# Patient Record
Sex: Female | Born: 1960 | Race: White | Hispanic: No | Smoking: Current every day smoker
Health system: Southern US, Community
[De-identification: ages and names within clinical notes are randomized; demographics above are authoritative.]

## PROBLEM LIST (undated history)

## (undated) DIAGNOSIS — F32A Depression, unspecified: Secondary | ICD-10-CM

## (undated) DIAGNOSIS — F329 Major depressive disorder, single episode, unspecified: Secondary | ICD-10-CM

## (undated) HISTORY — PX: TUBAL LIGATION: SHX77

## (undated) HISTORY — PX: OTHER SURGICAL HISTORY: SHX169

## (undated) HISTORY — PX: NECK SURGERY: SHX720

## (undated) HISTORY — DX: Depression, unspecified: F32.A

---
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
## 2001-12-18 ENCOUNTER — Inpatient Hospital Stay (HOSPITAL_COMMUNITY): Admission: EM | Admit: 2001-12-18 | Discharge: 2001-12-24 | Payer: Self-pay | Admitting: Psychiatry

## 2004-04-06 ENCOUNTER — Encounter: Admission: RE | Admit: 2004-04-06 | Discharge: 2004-04-06 | Payer: Self-pay | Admitting: Obstetrics and Gynecology

## 2004-05-30 ENCOUNTER — Ambulatory Visit (HOSPITAL_COMMUNITY): Admission: RE | Admit: 2004-05-30 | Discharge: 2004-05-30 | Payer: Self-pay | Admitting: Obstetrics and Gynecology

## 2005-01-11 ENCOUNTER — Encounter: Admission: RE | Admit: 2005-01-11 | Discharge: 2005-01-11 | Payer: Self-pay | Admitting: Obstetrics and Gynecology

## 2005-02-19 ENCOUNTER — Inpatient Hospital Stay (HOSPITAL_COMMUNITY): Admission: AD | Admit: 2005-02-19 | Discharge: 2005-02-20 | Payer: Self-pay | Admitting: Obstetrics and Gynecology

## 2005-03-01 ENCOUNTER — Inpatient Hospital Stay (HOSPITAL_COMMUNITY): Admission: AD | Admit: 2005-03-01 | Discharge: 2005-03-02 | Payer: Self-pay | Admitting: Obstetrics and Gynecology

## 2005-03-03 ENCOUNTER — Inpatient Hospital Stay (HOSPITAL_COMMUNITY): Admission: AD | Admit: 2005-03-03 | Discharge: 2005-03-03 | Payer: Self-pay | Admitting: Obstetrics & Gynecology

## 2005-03-05 ENCOUNTER — Inpatient Hospital Stay (HOSPITAL_COMMUNITY): Admission: AD | Admit: 2005-03-05 | Discharge: 2005-03-08 | Payer: Self-pay | Admitting: Obstetrics and Gynecology

## 2005-04-18 ENCOUNTER — Other Ambulatory Visit: Admission: RE | Admit: 2005-04-18 | Discharge: 2005-04-18 | Payer: Self-pay | Admitting: Otolaryngology

## 2006-04-09 ENCOUNTER — Inpatient Hospital Stay (HOSPITAL_COMMUNITY): Admission: AD | Admit: 2006-04-09 | Discharge: 2006-04-12 | Payer: Self-pay | Admitting: Obstetrics and Gynecology

## 2006-09-26 IMAGING — CR DG CHEST 2V
2 series · 2 of 2 positions shown · non-contrast
Comparison: 01/11/05.

CLINICAL DATA: Hypoxia and abnormal breath sounds on physical exam.  Status post c-section.
 CHEST ? 2 VIEW:

[view not recorded (1 of 2)]
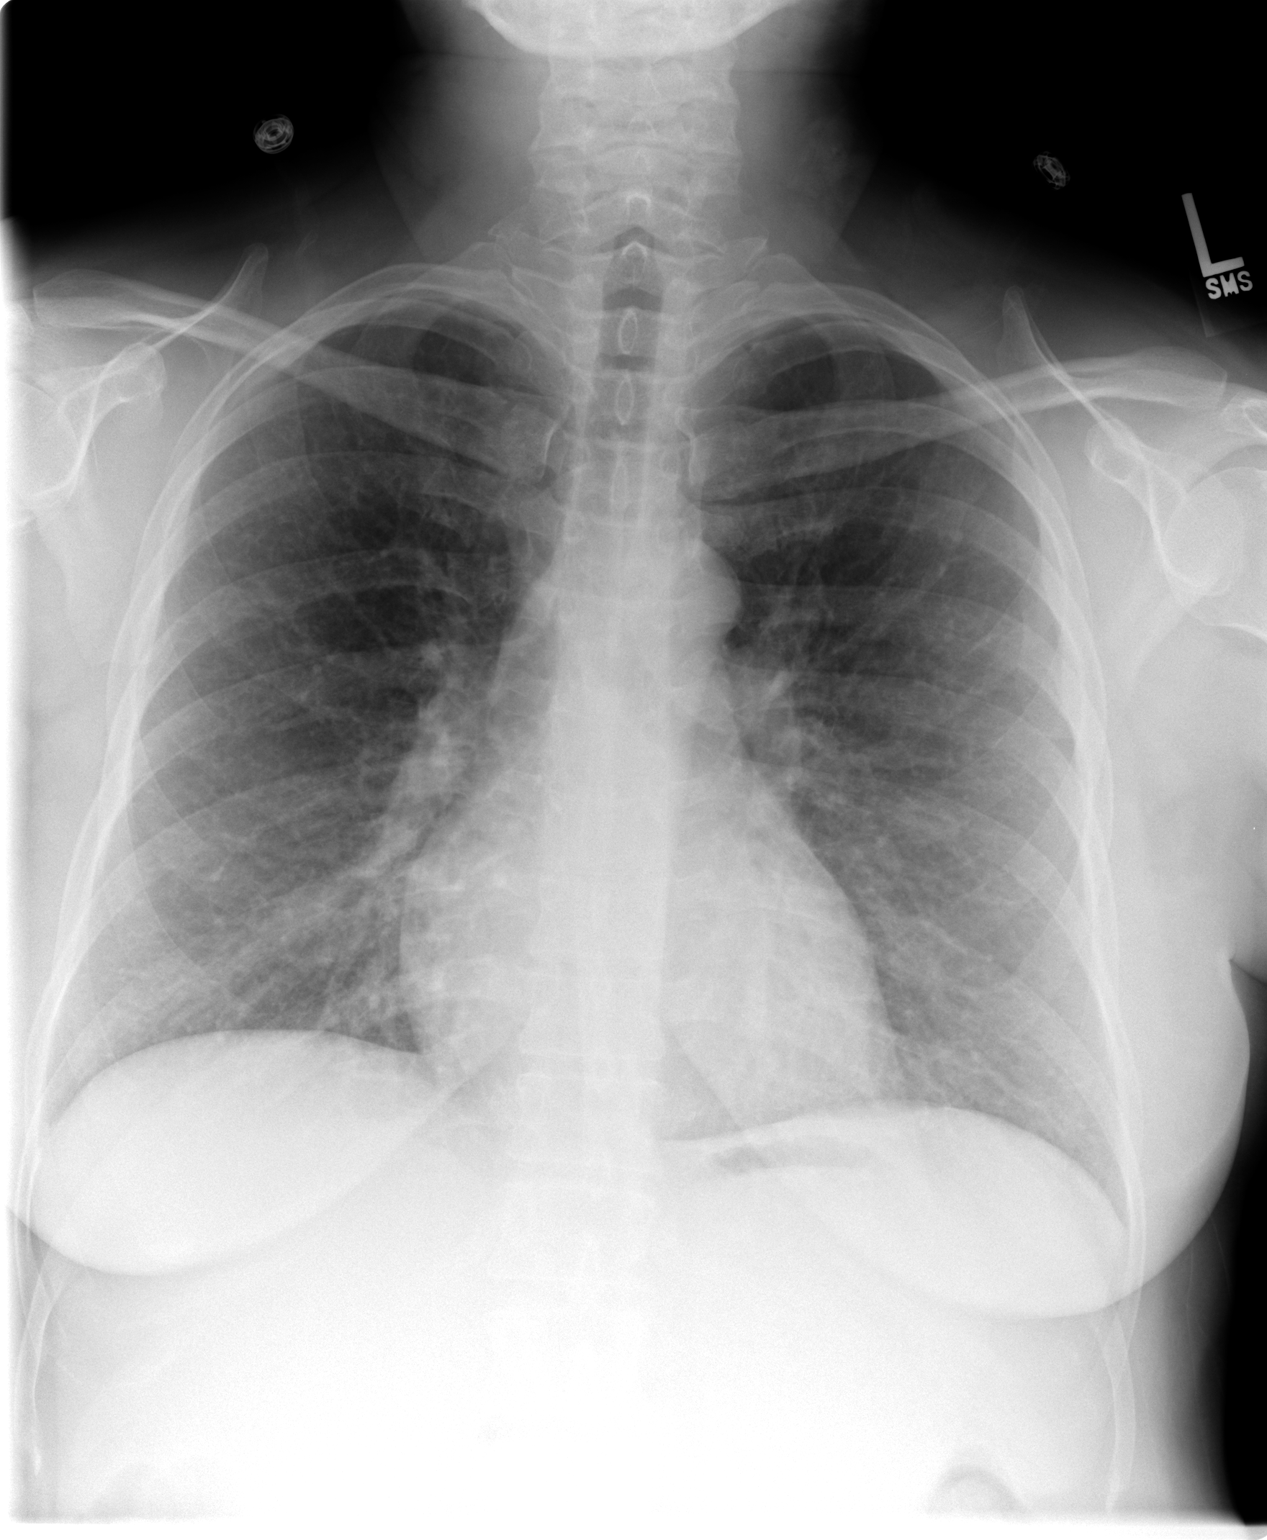

[view not recorded (2 of 2)]
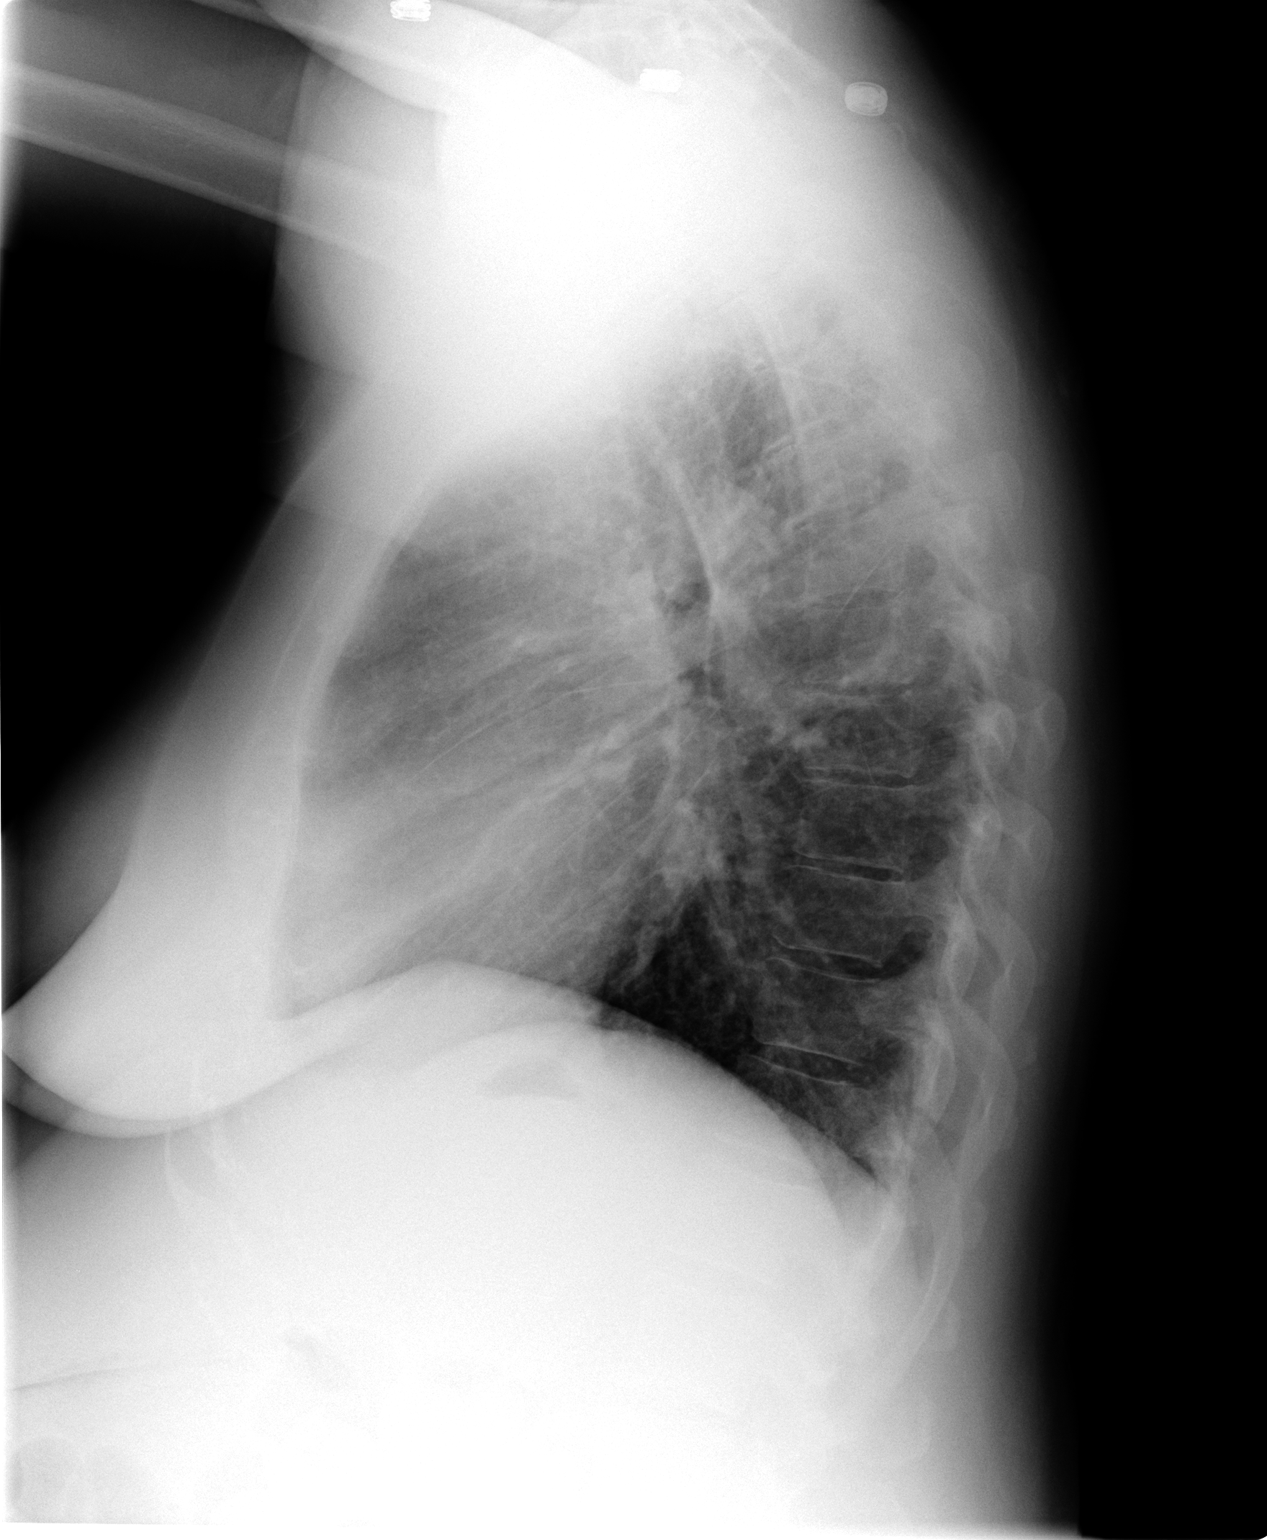

[2 of 2 positions shown; findings below may reference images not displayed]

FINDINGS: Heart size is normal.  Chronic interstitial prominence is seen which is stable.  There is no evidence of acute infiltrate or edema.  There is no evidence of pleural effusion.  No mass or adenopathy identified.
IMPRESSION: Stable chest.  No active disease.

## 2008-05-26 ENCOUNTER — Inpatient Hospital Stay (HOSPITAL_COMMUNITY): Admission: EM | Admit: 2008-05-26 | Discharge: 2008-05-27 | Payer: Self-pay | Admitting: Emergency Medicine

## 2009-12-16 IMAGING — CR DG CHEST 2V
2 series · 2 of 2 positions shown · non-contrast
Comparison: Radiographs 03/06/2005

CLINICAL DATA: Chest

CHEST - 2 VIEW

[w chest pa]
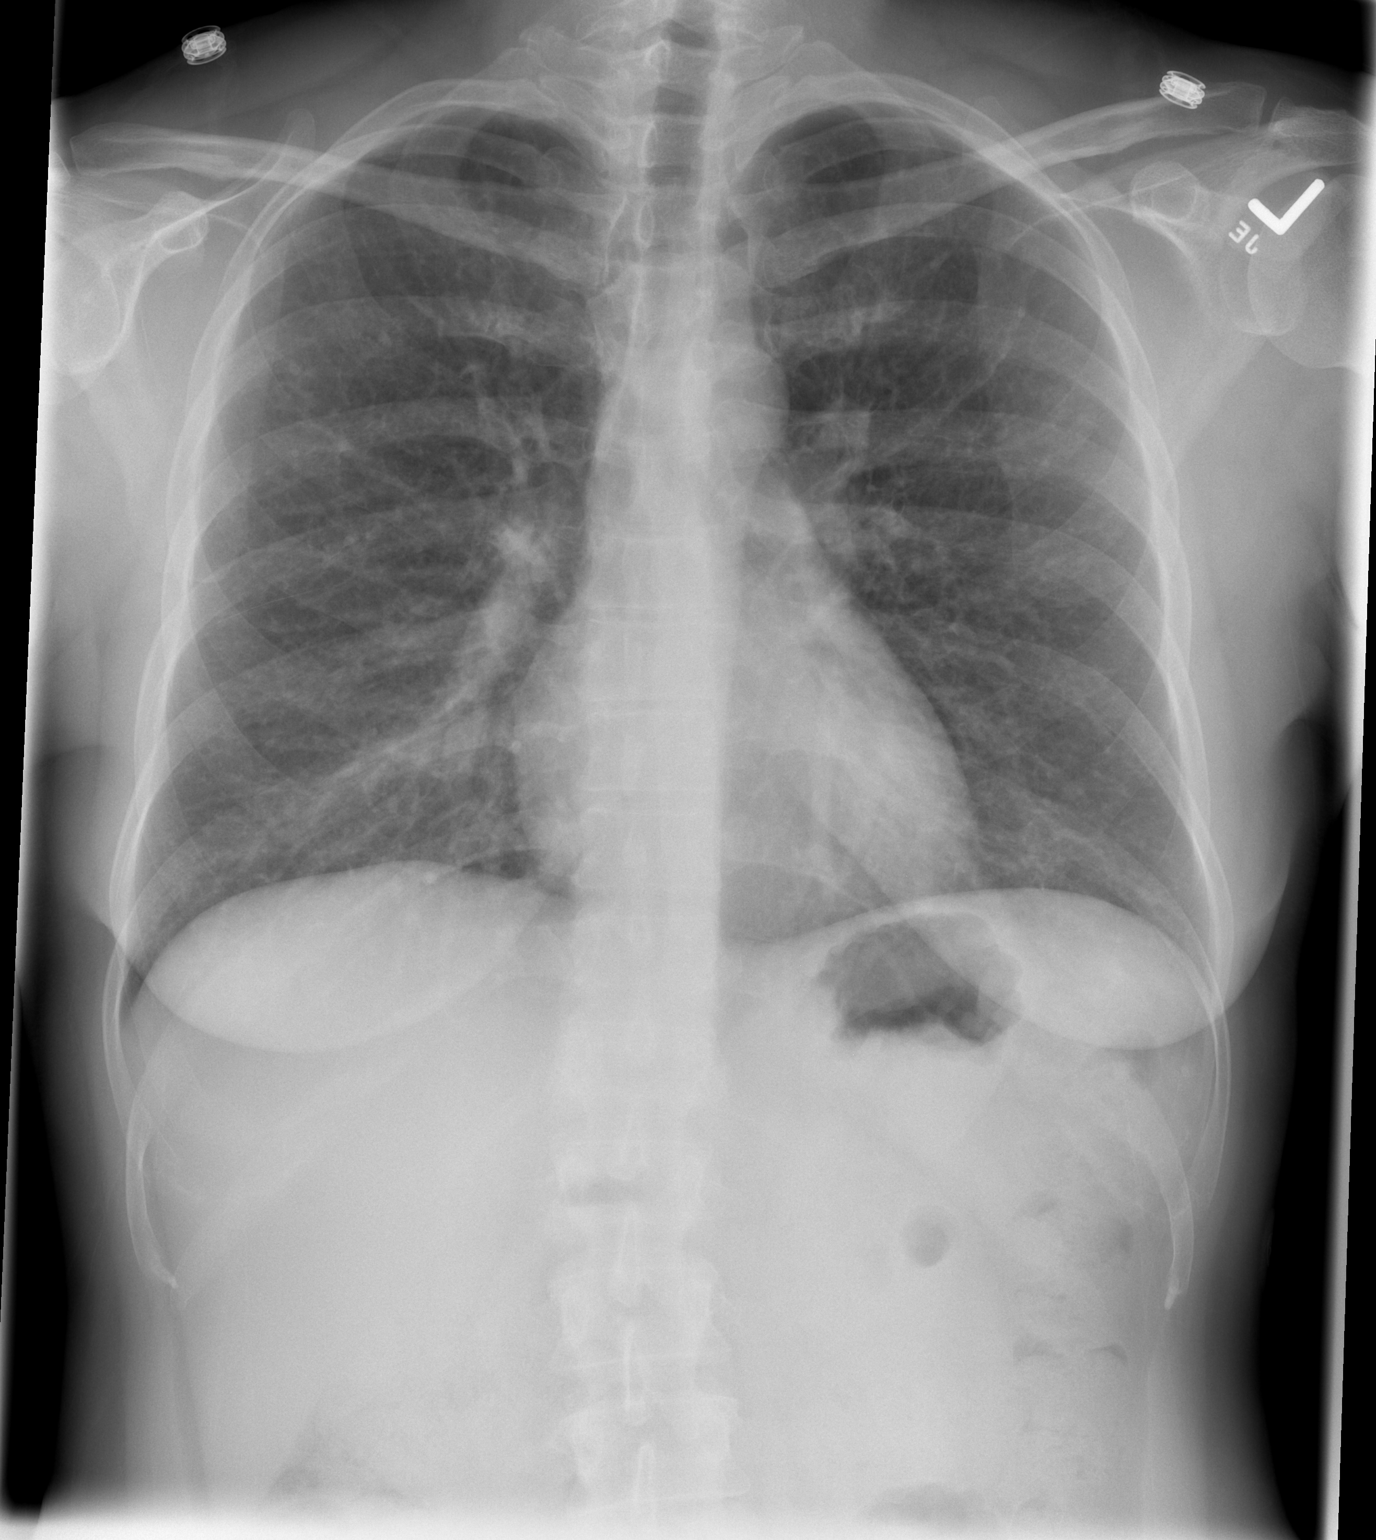

[w chest lat]
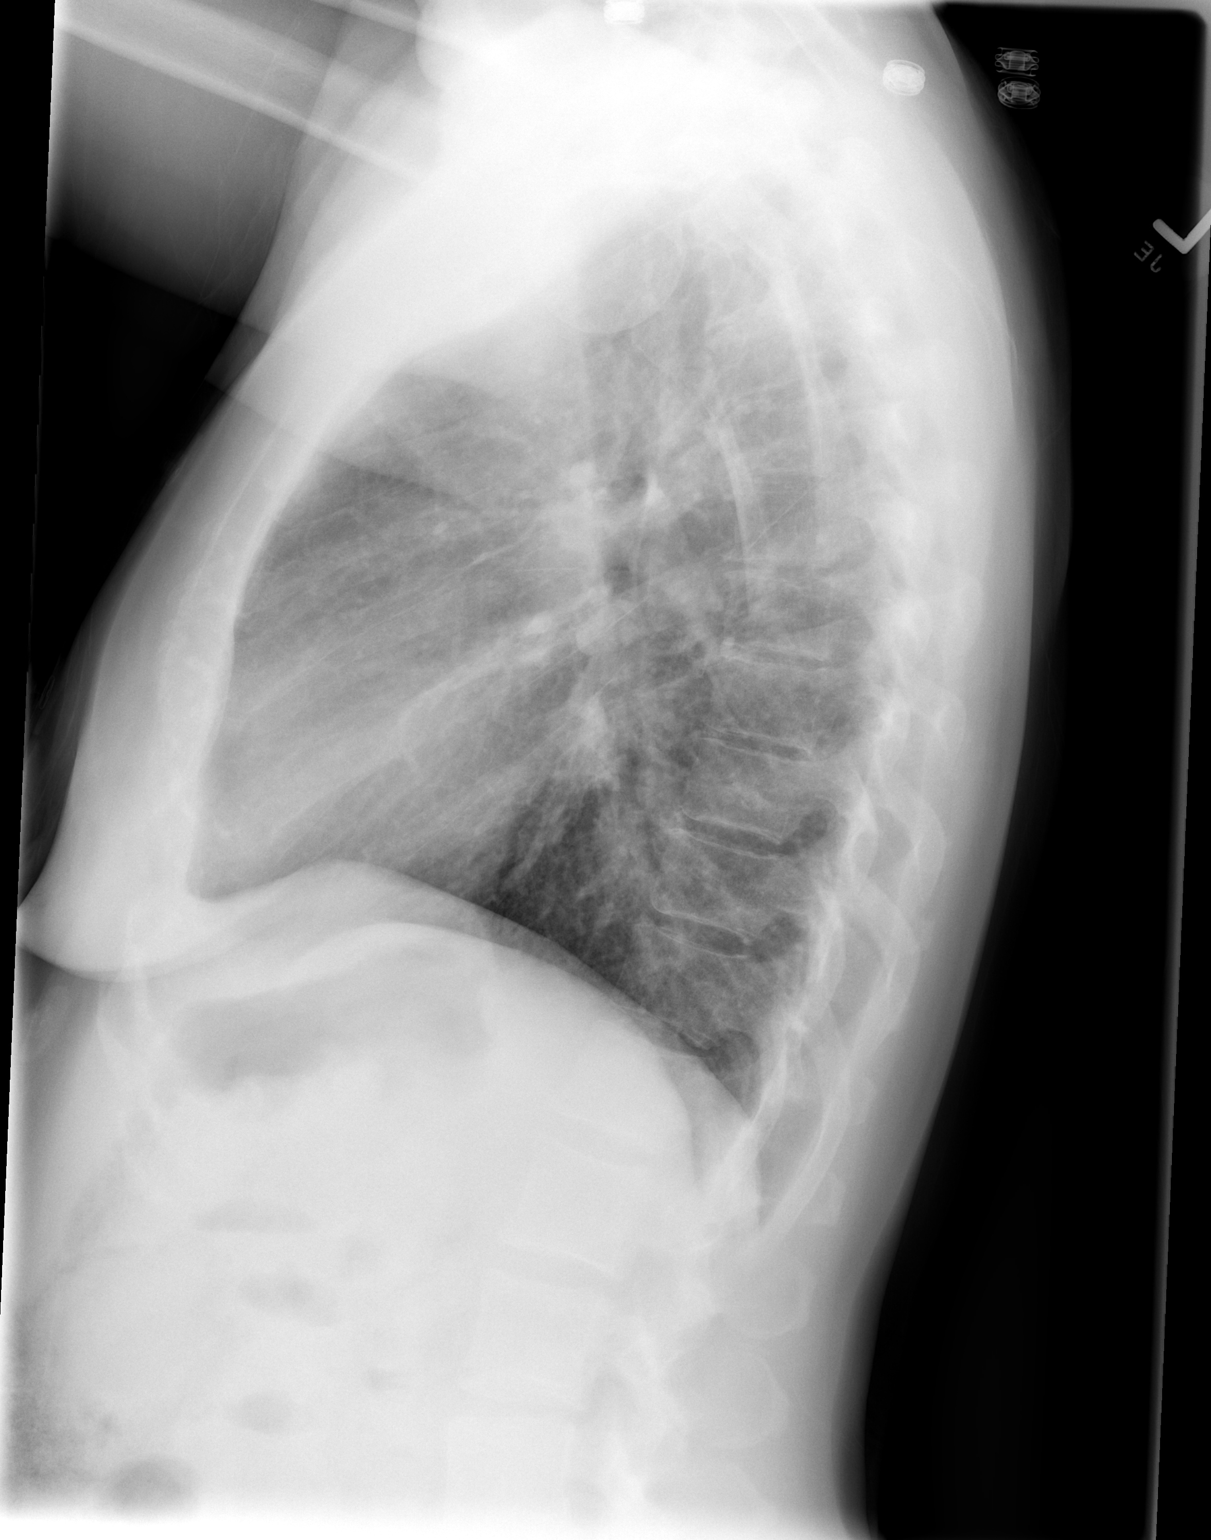

[2 of 2 positions shown; findings below may reference images not displayed]

FINDINGS: Chronic interstitial changes.  The heart and mediastinal
contours are normal. Thoracic spine degenerative changes.
IMPRESSION: No acute cardiopulmonary process.

## 2010-05-07 ENCOUNTER — Encounter: Payer: Self-pay | Admitting: Obstetrics and Gynecology

## 2010-08-01 LAB — POCT CARDIAC MARKERS
CKMB, poc: <1 ng/mL — ABNORMAL LOW (ref 1.0–8.0)
CKMB, poc: <1 ng/mL — ABNORMAL LOW (ref 1.0–8.0)
Myoglobin, poc: 34.2 ng/mL (ref 12–200)
Myoglobin, poc: 38.2 ng/mL (ref 12–200)
Troponin i, poc: <0.05 ng/mL (ref 0.00–0.09)
Troponin i, poc: <0.05 ng/mL (ref 0.00–0.09)

## 2010-08-01 LAB — COMPREHENSIVE METABOLIC PANEL
ALT: 12 U/L (ref 0–35)
AST: 11 U/L (ref 0–37)
Albumin: 3.9 g/dL (ref 3.5–5.2)
Alkaline Phosphatase: 72 U/L (ref 39–117)
BUN: 9 mg/dL (ref 6–23)
CO2: 24 mEq/L (ref 19–32)
Calcium: 8.6 mg/dL (ref 8.4–10.5)
Chloride: 101 mEq/L (ref 96–112)
Creatinine, Ser: 0.57 mg/dL (ref 0.4–1.2)
GFR calc Af Amer: >60 mL/min (ref >60)
GFR calc non Af Amer: >60 mL/min (ref >60)
Glucose, Bld: 93 mg/dL (ref 70–99)
Potassium: 3.1 mEq/L — ABNORMAL LOW (ref 3.5–5.1)
Sodium: 134 mEq/L — ABNORMAL LOW (ref 135–145)
Total Bilirubin: 0.3 mg/dL (ref 0.3–1.2)
Total Protein: 7.1 g/dL (ref 6.0–8.3)

## 2010-08-01 LAB — LIPID PANEL
Cholesterol: 132 mg/dL (ref 0–200)
HDL: 24 mg/dL — ABNORMAL LOW (ref >39)
LDL Cholesterol: 88 mg/dL (ref 0–99)
Total CHOL/HDL Ratio: 5.5 RATIO
Triglycerides: 99 mg/dL (ref <150)
VLDL: 20 mg/dL (ref 0–40)

## 2010-08-01 LAB — POCT I-STAT 3, ART BLOOD GAS (G3+)
Acid-base deficit: 3 mmol/L — ABNORMAL HIGH (ref 0.0–2.0)
Bicarbonate: 21.1 mEq/L (ref 20.0–24.0)
O2 Saturation: 93 %
TCO2: 22 mmol/L (ref 0–100)
pCO2 arterial: 35.1 mmHg (ref 35.0–45.0)
pH, Arterial: 7.387 (ref 7.350–7.400)
pO2, Arterial: 67 mmHg — ABNORMAL LOW (ref 80.0–100.0)

## 2010-08-01 LAB — CK TOTAL AND CKMB (NOT AT ARMC)
CK, MB: 0.6 ng/mL (ref 0.3–4.0)
Relative Index: INVALID (ref 0.0–2.5)
Total CK: 55 U/L (ref 7–177)

## 2010-08-01 LAB — POCT I-STAT 3, VENOUS BLOOD GAS (G3P V)
Acid-base deficit: 5 mmol/L — ABNORMAL HIGH (ref 0.0–2.0)
Bicarbonate: 21 mEq/L (ref 20.0–24.0)
O2 Saturation: 71 %
TCO2: 22 mmol/L (ref 0–100)
pCO2, Ven: 39.6 mmHg — ABNORMAL LOW (ref 45.0–50.0)
pH, Ven: 7.333 — ABNORMAL HIGH (ref 7.250–7.300)
pO2, Ven: 39 mmHg (ref 30.0–45.0)

## 2010-08-01 LAB — APTT: aPTT: 29 seconds (ref 24–37)

## 2010-08-01 LAB — CARDIAC PANEL(CRET KIN+CKTOT+MB+TROPI)
CK, MB: 0.5 ng/mL (ref 0.3–4.0)
CK, MB: 1 ng/mL (ref 0.3–4.0)
Relative Index: INVALID (ref 0.0–2.5)
Relative Index: INVALID (ref 0.0–2.5)
Total CK: 53 U/L (ref 7–177)
Total CK: 57 U/L (ref 7–177)
Troponin I: 0.01 ng/mL (ref 0.00–0.06)
Troponin I: <0.01 ng/mL (ref 0.00–0.06)

## 2010-08-01 LAB — BASIC METABOLIC PANEL
BUN: 7 mg/dL (ref 6–23)
CO2: 25 mEq/L (ref 19–32)
Calcium: 8.6 mg/dL (ref 8.4–10.5)
Chloride: 109 mEq/L (ref 96–112)
Creatinine, Ser: 0.53 mg/dL (ref 0.4–1.2)
GFR calc Af Amer: >60 mL/min (ref >60)
GFR calc non Af Amer: >60 mL/min (ref >60)
Glucose, Bld: 87 mg/dL (ref 70–99)
Potassium: 3.8 mEq/L (ref 3.5–5.1)
Sodium: 139 mEq/L (ref 135–145)

## 2010-08-01 LAB — DIFFERENTIAL
Basophils Absolute: 0.1 10*3/uL (ref 0.0–0.1)
Basophils Relative: 2 % — ABNORMAL HIGH (ref 0–1)
Eosinophils Absolute: 0.1 10*3/uL (ref 0.0–0.7)
Eosinophils Relative: 2 % (ref 0–5)
Lymphocytes Relative: 30 % (ref 12–46)
Lymphs Abs: 2.9 10*3/uL (ref 0.7–4.0)
Monocytes Absolute: 0.4 10*3/uL (ref 0.1–1.0)
Monocytes Relative: 5 % (ref 3–12)
Neutro Abs: 5.9 10*3/uL (ref 1.7–7.7)
Neutrophils Relative %: 62 % (ref 43–77)

## 2010-08-01 LAB — CBC
HCT: 43.4 % (ref 36.0–46.0)
Hemoglobin: 15.1 g/dL — ABNORMAL HIGH (ref 12.0–15.0)
MCHC: 34.9 g/dL (ref 30.0–36.0)
MCV: 90.7 fL (ref 78.0–100.0)
Platelets: 250 10*3/uL (ref 150–400)
RBC: 4.78 MIL/uL (ref 3.87–5.11)
RDW: 12.7 % (ref 11.5–15.5)
WBC: 9.4 10*3/uL (ref 4.0–10.5)

## 2010-08-01 LAB — MAGNESIUM: Magnesium: 2 mg/dL (ref 1.5–2.5)

## 2010-08-01 LAB — PROTIME-INR
INR: 1 (ref 0.00–1.49)
Prothrombin Time: 13.5 seconds (ref 11.6–15.2)

## 2010-08-01 LAB — TROPONIN I: Troponin I: <0.01 ng/mL (ref 0.00–0.06)

## 2010-08-01 LAB — D-DIMER, QUANTITATIVE: D-Dimer, Quant: 0.26 ug/mL-FEU (ref 0.00–0.48)

## 2010-08-29 NOTE — Discharge Summary (Signed)
NAME:  Margaret Paul, Margaret Paul NO.:  [id number]   MEDICAL RECORD NO.:  [id number]          PATIENT TYPE:  INP   LOCATION:  5501                         FACILITY:  [facility]   PHYSICIAN:  Marcellus Scott, MD     DATE OF BIRTH:  [birth year]   DATE OF ADMISSION:  05/26/2008  DATE OF DISCHARGE:  05/27/2008                               DISCHARGE SUMMARY   PRIMARY MEDICAL DOCTOR:  Gentry Fitz.   DISCHARGE DIAGNOSES:  1. Chest pain.  Cardiac workup negative.  Resolved.  2. Hypokalemia - repleted.  3. Tobacco abuse.  4. Dyslipidemia, low HDL.   DISCHARGE MEDICATIONS:  1. Prilosec over-the-counter 20 mg p.o. daily.  2. Nicotine 21 mg per 24-hour patch, 1 daily for 6 weeks, then taper.   PROCEDURES:  1. Cardiac catheterization by Dr. Elsie Lincoln.  Please refer to his      procedure note for details.  Results:      a.     Angiographically patent coronary arteries.      b.     Normal left ventricular systolic function, ejection fraction       60%.      c.     Normal right heart hemodynamics.      d.     Normal cardiac outputs.  He recommended candidate for discharge from cardiac standpoint 4 hours  after cardiac catheterization.  1. Chest x-ray on February 10:  No acute cardiopulmonary process.   PERTINENT LABORATORY DATA:  Cardiac panel cycled and negative.  Magnesium 2.  Basic metabolic panel with BUN 7, creatinine 0.53,  potassium 3.8.  Coagulation indices were normal.  Lipid panel with HDL  of 24, otherwise unremarkable.  D-dimer negative at 0.26.  CBCs with  hemoglobin 15.1, hematocrit 43.4, white blood cell 9.4, platelets 250.   CONSULTATIONS:  Dr. Elsie Lincoln from [facility].   HOSPITAL COURSE AND PATIENT DISPOSITION:  Margaret Paul is a 50 year old  female patient with history of smoking who presented with chest pain.  She was admitted for further evaluation and management.  Cardiology was  consulted.  Initially, a stress test was planned.   Subsequently however,  the patient underwent cardiac catheterization.  She had been on aspirin,  statins, nitro paste in the hospital.  Cardiac catheterization was  negative.  Cardiology has cleared her for discharge 4 hours after the  catheterization.  Unsure what the etiology of the chest pain is.  Question secondary to gastric etiology.  We will discharge the patient  on proton-pump inhibitor.  Will counsel strongly regarding tobacco  cessation.  The patient indicates she will definitely try this and has  requested a nicotine patch which will be provided.  The patient requests  assistance finding a primary medical doctor.  Her husband has [employer].  Will request case manager consult in  helping with the same.  We recommended diet and exercise to assist with  improvement in her low HDL.  Hypokalemia has been repleted.   Time spent on discharge, 20 minutes.      Theadora Rama  Waymon Amato, MD  Electronically Signed     AH/MEDQ  D:  05/27/2008  T:  05/27/2008  Job:  [id number]   cc:   Madaline Savage, M.D.

## 2010-08-29 NOTE — H&P (Signed)
NAME:  Margaret Paul, Margaret Paul NO.:  [id number]   MEDICAL RECORD NO.:  [id number]          PATIENT TYPE:  INP   LOCATION:  5501                         FACILITY:  [facility]   PHYSICIAN:  Massie Maroon, MD        DATE OF BIRTH:  May 08, 1960   DATE OF ADMISSION:  05/26/2008  DATE OF DISCHARGE:                              HISTORY & PHYSICAL   ER states she had no primary care physician.   CHIEF COMPLAINT:  Chest pain.   HISTORY OF PRESENT ILLNESS:  A 50 year old female with no significant  past medical history, complains of chest tightness beginning at about  1:30 this afternoon.  She went to sleep and it went away.  She woke up  at about 4:30 and experienced more chest tightness and so came to the  emergency room.  Her EKG showed normal sinus rhythm at 80, normal axis,  normal intervals, no ST-T segment changes consistent with acute  ischemia.  The patient was treated with sublingual nitroglycerin x1 per  patient without relief.  The patient states that the chest tightness is  substernal.  There is no radiation.  Nothing appears to make it  significantly better or worse.  Position does not seem to affect the  discomfort.  The patient denies any GERD, palpitations, shortness of  breath, nausea, vomiting, diaphoresis.  The patient does have some  cough, which is dry over the last couple of days.  The patient denies  any fever, chills.  Chest x-ray was negative for any acute infiltrate.  The patient will be admitted for workup of chest pain.   PAST MEDICAL HISTORY:  None.   PAST SURGICAL HISTORY:  Tubal ligation and reversal.   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  She  smokes 1-pack per day times 25 years.  She does not drink.   FAMILY HISTORY:  Her father had a valve replacement surgery.  There is  no family history of coronary artery disease.   REVIEW OF SYSTEMS:  Negative for all 10 oriented systems except for  pertinent positive as stated  above.   ALLERGIES:  No known drug allergies.   MEDICATIONS:  None.   PHYSICAL EXAMINATION:  VITALS:  Temperature 98.7, pulse 80, blood  pressure 125/78, pulse ox 98% on room air.  HEENT:  Anicteric, EOMI, no nystagmus, pupils 1.5 mm, symmetric, direct,  concentric, reflexes intact.  Mucous membranes moist.  Tongue midline.  NECK:  No JVD, no bruits, no thyromegaly, no adenopathy.  HEART:  Regular rate and rhythm.  S1 and S2.  No murmurs, gallops, or  rubs.  LUNGS:  Slight crackles at the bibasilar bases that clear with cough, no  wheezes.  ABDOMEN:  Soft, nontender, nondistended.  Positive bowel sounds.  EXTREMITIES:  No cyanosis, clubbing, or edema.  NEURO:  Cranial nerves II through XII intact.  Reflexes 2+, symmetric,  diffuse with downgoing toes bilaterally, motor strength 5/5 in all 4  extremities, pin prick intact.  LYMPH NODES:  None.  SKIN:  No rashes.  LABORATORY DATA:  Troponin I less than 0.01.  EKG see above.  Chest x-  ray, per my read negative for any acute process.  D-dimer was negative.   ASSESSMENT:  1. Chest pain.  2. Rash on hands, possibly tinea.  3. Tobacco dependence.   PLAN:  The patient will be placed on telemetry.  We will check troponin  I q.8 h. x3 sets.  We will check lipid panel.  The patient was told not  to smoke if possible.  A 5 minutes of smoking cessation counseling were  dispensed.  The patient seems somewhat accepting of the counseling.  If  troponins are negative, we will obtain an exercise thallium nuclear  stress test to rule out ischemic heart disease.  This patient will be  placed on aspirin, Protonix, and Crestor 20 mg nightly, will use  Nitropaste 1-1/2-inch topically t.i.d. to see if this will alleviate  chest pain.      Massie Maroon, MD  Electronically Signed     JYK/MEDQ  D:  05/26/2008  T:  05/27/2008  Job:  [id number]

## 2010-08-29 NOTE — Cardiovascular Report (Signed)
NAME:  Margaret Paul, ROSCOE NO.:  [id number]   MEDICAL RECORD NO.:  [id number]          PATIENT TYPE:  INP   LOCATION:  5501                         FACILITY:  [facility]   PHYSICIAN:  Madaline Savage, M.D.DATE OF BIRTH:  03/02/1961   DATE OF PROCEDURE:  05/27/2008  DATE OF DISCHARGE:                            CARDIAC CATHETERIZATION   PROCEDURES PERFORMED:  1. Elective cardiac catheterization by Judkins technique.  2. Retrograde left heart catheterization with combined right and left      heart catheterization.  3. Selective coronary angiography by Judkins technique.  4. Retrograde left heart catheterization, right heart catheterization,      and Fick cardiac output determinations.   COMPLICATIONS:  None.   ENTRY SITE:  Right femoral.   DYE USED:  Omnipaque.   PATIENT PROFILE:  Margaret Paul is a 50 year old married female who is the  mother of 7 children who enters the [facility] on May 26, 2008 with complaints of chest tightness beginning at 1:30 in the  afternoon on the day of admission and that discomfort went away after  she went to sleep, but she woke up about 4:30 and experienced more chest  tightness and because of that came to the emergency room.  An EKG there  showed normal sinus rhythm with no ST-segment changes to suggest  ischemia.  She was given nitroglycerin, but there was no relief.  The  patient's risk factors for coronary disease includes a 25-pack year  history of tobacco smoking, a father who had a valve replacement and  possibly coronary artery disease, and no other real risk factors.  The  patient has a history of having had tubal ligation in the past.  Today's  procedure was performed electively by the right percutaneous femoral  artery approach and no complications occurred.   RESULTS:  Pressures:  The left ventricular pressure was 96/5.  End-  diastolic pressure 8.  Central aortic pressure was 97/67 and mean of 82.  There was  no significant aortic valve gradient by pullback technique.  The right atrial mean pressure was 8.  Right ventricular pressure was  26/6.  Pulmonary artery pressure was 22/11.  Pulmonary capillary wedge  mean pressure was 9.  Left ventricular pressure as previously stated was  96/5.  End-diastolic pressure 8.  Pulmonary wedge pressure was 7.  There  was a pulmonary artery saturation of 71 and a central aortic pressure of  93.  The patient had angiographically patent coronary arteries,  specifically the left main coronary artery was medium in size and has  slightly upward takeoff without any lesions seen.   LAD coursed to the cardiac apex giving rise to a septal perforator  branch which arises well before a bifurcating diagonal branch.  LAD,  bifurcating diagonal branch, and septals were all widely patent.  The  LAD wrapped around the apex.   Circumflex coronary artery gave rise to a very large obtuse marginal  branch and the circumflex itself showed no evidence of lesions.  There  was a large atrial circumflex branch arising from the midportion  of the  circumflex.   Right coronary artery had a downward takeoff and there was a lot of  catheter-induced spasm.  There was no evidence of any stenosis in the  RCA and its branches.  Those branches included a medium-size right acute  marginal branch and the proximal mid and distal RCA were all normal.  Distally, there was a posterior descending branch of moderate size and a  bifurcating posterolateral branch which was also normal.   Left ventricular angiography showed vigorous contractility of all wall  segments.  No mitral regurgitation seen.  LVEF was 60%.   FINAL IMPRESSION:  1. Angiographically patent coronary arteries.  2. Recent episodes of chest pain what appeared to be noncardiac      related.  3. Normal left ventricular systolic function.  4. Normal right heart hemodynamics.  5. Normal cardiac output determinations.   PLAN:   The patient would be a candidate for discharge from the cardiac  standpoint sometime in the next day or so.  I will turn the case back  over to the doctors of the [facility] for discharge  disposition, since there may be other conditions such as hypokalemia,  dyslipidemia, and tobacco abuse that need to be addressed.           ______________________________  Madaline Savage, M.D.     WHG/MEDQ  D:  05/27/2008  T:  05/28/2008  Job:  [id number]   cc:   Marcellus Scott, MD

## 2010-09-01 NOTE — H&P (Signed)
NAME:  Margaret Paul, Margaret Paul NO.:  [id number]   MEDICAL RECORD NO.:  [id number]          PATIENT TYPE:  INP   LOCATION:  9170                          FACILITY:  [facility]   PHYSICIAN:  Richardean Sale, M.D.   DATE OF BIRTH:  1960-06-27   DATE OF ADMISSION:  03/01/2005  DATE OF DISCHARGE:                                HISTORY & PHYSICAL   Please disregard previous dictation from today, as that dictation seemed to  be cut off.   ADMITTING DIAGNOSIS:  A 48 week intrauterine pregnancy with gestational  hypertension and possible latent labor.   HISTORY OF PRESENT ILLNESS:  This is a 50 year old, gravida 7, para 5-0-1-5,  white female, who is at 71 weeks' gestation by first trimester ultrasound,  who presented to the office today for a routine antenatal visit.  Pregnancy  has been complicated by gestational hypertension, and patient has been on  home bed rest for approximately 32 weeks with this pregnancy.  She has had  no proteinuria, and preeclampsia labs have all been within normal limits.  The patient complained today of significant increase in lower extremity  edema.  She reports good fetal movement, denied any loss of fluid or vaginal  bleeding, but complained of irregular contractions.  Upon arrival at  maternity admissions, the patient's blood pressures were elevated in the  150s/80s.  Preeclampsia labs are drawn which were within normal limits.  The  patient continued to have contractions every 2-5 minutes while in maternity  admissions.  Fetal heart rate tracing was reactive, and the patient's cervix  changed from 1 to 3 cm.  Prenatal care has been at [facility] with Dr.  Richardean Sale as the primary attending.  Pregnancy has been complicated by  gestational hypertension, advanced maternal age, increased Down syndrome  risk of 1:33.  The patient declined amniocentesis.  Significant history of  depression which is managed by Vonda Antigua.  She was a smoker but  discontinued smoking at her third trimester.  This pregnancy was achieved  with the use of Clomid after a tubal reversal.   REVIEW OF SYSTEMS:  Positive for fetal movement and irregular contractions.  Negative for vaginal bleeding, loss of fluid, headache, visual changes, or  epigastric pain.  All other systems negative.   PAST OBSTETRIC HISTORY:  Spontaneous vaginal delivery x 5.  Pelvis proven to  9 pounds 10 ounces.  The patient has a history of rapid labor with last  delivery only 2 hours in labor. TAB x1.   GYNECOLOGIC HISTORY:  No history of abnormal Pap smears or STDs.  She is  status post tubal reversal in October 2005.  This pregnancy was achieved  with Clomid.   PAST MEDICAL HISTORY:  Major depression.  She is managed by Dr. Vonda Antigua.   PAST SURGICAL HISTORY:  None.   FAMILY HISTORY:  Negative for any birth defects, congenital anomalies, Down  syndrome, or any other inherited abnormalities.  Positive for thyroid  dysfunction, hypertension, heart disease.   SOCIAL HISTORY:  She is a former smoker, quit in the early  3rd trimester,  denies any alcohol or drugs and is married.   MEDICATIONS:  1.  Wellbutrin XL 300 mg p.o. daily.  2.  Abilify 15 mg p.o. daily.  3.  Trazodone 100 mg p.o. daily.  4.  Prenatal vitamins p.o. daily.   ALLERGIES:  No known drug allergies.   PHYSICAL EXAMINATION:  VITAL SIGNS:  Blood pressures ranged from 130-170s  over 70s-90s.  All other vital signs are stable.  Fetal heart rate tracing  is reactive.  Tocometer tracing shows contractions irregular every 2-5  minutes.  GENERAL:  She is a well-developed, well-nourished white female who is in no  acute distress.  HEART:  Regular rate and rhythm.  LUNGS:  Clear to auscultation bilaterally.  ABDOMEN:  Soft, gravid, nontender.  CERVIX:  3 cm, 60%, and -2 station vertex.  EXTREMITIES:  3+ pitting edema bilaterally to the knee.  There is no  periorbital edema.  Trace edema in the hands.   NEUROLOGIC EXAM:  Nonfocal.  Deep tendon reflexes are 2-3+ bilaterally with  no clonus.   PRENATAL LABORATORY DATA:  Blood type is B positive, antibody screen  negative, RPR nonreactive, rubella immune, hepatitis B surface antigen  nonreactive, HIV nonreactive.  AFP within normal limits.  Ultra screen  showed increased Down syndrome risk of 1:33.  One hour Glucola normal at  123.  Gonorrhea and Chlamydia screens negative.  Pap smear normal.  Group B  beta strep negative.  First trimester blood pressure was 114/64.   ASSESSMENT:  A 50 year old, gravida 7, para 5-0-1-5 white female at 42  weeks' gestation in possible early labor with hypertension and significant  lower extremity edema, also 6 pound weight gain in 1 week.   PLAN:  1.  We will admit to labor and delivery.  2.  Check serial blood pressures.  3.  Continue with fetal monitoring.  4.  Check preeclampsia labs.  5.  Monitor contractions.  If labs abnormal or BP remains elevated will      augment with pitocin.      Richardean Sale, M.D.  Electronically Signed     JW/MEDQ  D:  03/01/2005  T:  03/01/2005  Job:  [id number]

## 2010-09-01 NOTE — Discharge Summary (Signed)
NAME:  Margaret Paul, Margaret Paul NO.:  [id number]   MEDICAL RECORD NO.:  [id number]                   PATIENT TYPE:  IPS   LOCATION:  0403                                 FACILITY:  [facility]   PHYSICIAN:  Jeanice Lim, M.D.              DATE OF BIRTH:  09-20-1960   DATE OF ADMISSION:  12/18/2001  DATE OF DISCHARGE:  12/24/2001                                 DISCHARGE SUMMARY   IDENTIFYING DATA:  This is a 50 year old married Caucasian female  involuntarily committed due to anxiety, confusion, paranoia, hallucinations,  thinking furniture had been moved, believing that her children were missing  on the day before her admission.   MEDICATIONS:  None.   DRUG ALLERGIES:  No known drug allergies.   PHYSICAL EXAMINATION:  GENERAL: Essentially within normal limits.  NEUROLOGIC: Nonfocal.   LABORATORY DATA:  Routine admission labs were essentially within normal  limits including CMET and CBC.  Alcohol level was 0.  Urinalysis: Negative.  Urine drug screen: Negative.   MENTAL STATUS EXAM:  This is an alert, young middle-aged female, cooperative  with good eye contact.  Speech was clear.  Mood: Anxious and depressed.  Affect: Sad and anxious.  Thought process: Positive for paranoia and  delusions, no auditory and visual hallucinations, no flight of ideas, no  suicidal or homicidal ideation.  Cognitive: Intact.  Judgment and insight:  Poor.   ADMISSION DIAGNOSES:   AXIS I:  1. Psychotic disorder, not otherwise specified.  2. 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   AXIS II:  None.   AXIS III:  Status post closed head injury.   AXIS IV:  Problems with primary support group.   AXIS V:  [id number]   HOSPITAL COURSE:  The patient was admitted, ordered routine p.r.n.  medications, underwent further monitoring, and was encouraged to participate  in individual, group, and milieu therapy.  The patient remained paranoid and  was ordered Zyprexa and  Ativan p.r.n. anxiety and psychotic symptoms.  These  were discontinued and Risperdal was started at low dose, targeting psychotic  symptoms, Klonopin 0.5 mg b.i.d. for acute anxiety, and Lexapro.  Risperdal  was titrated and Lexapro, targeting depressive and psychotic symptoms.  Family session was held.  Aftercare planning was discussed.  The patient  reported a positive response to medications, feeling less paranoid and other  thoughts were more clear.  She became more reality based.   CONDITION ON DISCHARGE:  Condition on discharge was markedly improved.  Mood  was more euthymic.  Affect: Brighter.  Thought processes: Goal directed.  Thought content: Negative for dangerous ideation or psychotic symptoms.   DISCHARGE MEDICATIONS:  1. Klonopin 0.5 mg b.i.d.  2. Risperdal 1 mg one half at 8 a.m., one half at 3 p.m., and two q.h.s.  3. Lexapro 10 mg q.a.m.   FOLLOW UP:  The patient is to follow up at  [facility] on September 16 at 10:30 a.m.   DISCHARGE DIAGNOSES:   AXIS I:  1. Psychotic disorder, not otherwise specified.  2. 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   AXIS II:  None.   AXIS III:  Status post closed head injury.   AXIS IV:  Problems with primary support group.   AXIS V:  Global assessment of functioning on discharge was 55.                                                Jeanice Lim, M.D.    JEM/MEDQ  D:  01/28/2002  T:  01/29/2002  Job:  [id number]

## 2010-09-01 NOTE — H&P (Signed)
NAME:  Margaret Paul, Margaret Paul NO.:  [id number]   MEDICAL RECORD NO.:  [id number]          PATIENT TYPE:  INP   LOCATION:  9175                          FACILITY:  [facility]   PHYSICIAN:  Richardean Sale, M.D.   DATE OF BIRTH:  [birth year]   DATE OF ADMISSION:  03/05/2005  DATE OF DISCHARGE:                                HISTORY & PHYSICAL   ADMITTING DIAGNOSES:  37+ week intrauterine pregnancy with mild  preeclampsia.   HISTORY OF PRESENT ILLNESS:  This is a 50 year old gravida 7, para 5-0-1-5  white female who was at 37+ weeks gestation with a due date of March 23, 2005 by first trimester ultrasound who presents today for induction of  labor.  Patient's pregnancy has been complicated by gestational  hypertension.  She has been on bed rest and has been well controlled without  medication.  Preeclampsia laboratory studies and urine protein have all been  within normal limits up until today when a 24-hour urine collection showed  approximately 340 mg of protein thus making the diagnosis of mild  preeclampsia.  Patient does complain of significant lower extremity edema,  denies any facial edema.  She reports good movement.  Denies loss of fluids  after the contractions.  Denies any headaches, visual changes, or epigastric  pain.  Prenatal care has been at [facility] with Dr. Richardean Sale as  the primary attending.  Pregnancy has been complicated by gestational  hypertension controlled with bed rest, advanced maternal age with increased  Down syndrome risk on first trimester screen, normal ultrasound anatomic  survey.  Patient underwent consultation with Dr. Particia Nearing and declined  amniocentesis.  Second trimester maternal serum AFP was within normal  limits.  Pregnancy is also complicated by significant depression.  She has  been on Abilify, Trazodone, and Wellbutrin throughout her pregnancy and has  been managed throughout her pregnancy by her psychiatrist, Dr.  Arther Abbott.  Patient is also a former smoker who quit in the beginning of her third  trimester of this pregnancy.  During this pregnancy for the patient's  gestational hypertension she did undergo evaluation by cardiology for  possible chronic hypertension.  She had a normal 2-D echocardiogram.   PAST MEDICAL HISTORY:  Depression requiring hospitalization.   PAST SURGICAL HISTORY:  None.   FAMILY HISTORY:  No known birth defects, congenital anomalies, Down  syndrome, cystic fibrosis, spina bifida, or other chromosomal abnormalities.  Positive for hypertension, heart disease, asthma, diabetes.   PAST GYNECOLOGICAL HISTORY:  No history of abnormal Pap smears or sexually  transmitted infections.  The patient has had a tubal ligation with a tubal  reversal in October 2005.   PAST OBSTETRICAL HISTORY:  Gravida 1 first trimester TAB, no complications.  #2 spontaneous vaginal delivery 7 pounds [redacted] weeks gestation, no  complications.  Gravida 3 spontaneous vaginal delivery at 40 weeks 9 pounds  8 ounces with broken collar bone, otherwise no complications.  Gravida 4  spontaneous vaginal delivery at 40 weeks 7 pounds, no complications.  Gravida 5 spontaneous vaginal delivery at 40 weeks approximately  7 pounds,  no complications.  Gravida 6 spontaneous vaginal delivery at 40 weeks 9  pounds 10 ounces, no complications.  Of note, the patient's last four  deliveries have had very short labors lasting only two to three hours.   ALLERGIES:  No known drug allergies.   MEDICATIONS:  Wellbutrin, Abilify, Trazodone, prenatal vitamins.   REVIEW OF SYSTEMS:  Positive for fetal movement.  Negative for loss of  fluid, vaginal bleeding, regular contractions, headache, visual changes, or  epigastric pain.   PHYSICAL EXAMINATION:  VITAL SIGNS:  Patient is afebrile.  Blood pressures  ranged from 130s-150s, diastolics in the 80s.  Fetal heart rate tracing is  reactive.  Tocometer tracing shows irregular  contractions.  GENERAL:  She is a well-developed, well-nourished white female who is in no  acute distress.  HEART:  Regular rate and rhythm.  LUNGS:  Clear to auscultation bilaterally.  ABDOMEN:  Gravid, soft, nontender.  Appears AGA.  EXTREMITIES:  There is 2-3+ pitting edema to the shin bilaterally.  Deep  tendon reflexes are 2-3+ bilaterally with no clonus.  PELVIC:  Cervix is 3-4 cm, 60%, -2 station, vertex.  Last ultrasound at 35  weeks showed an AGA fetus at 6.5 pounds, normal amniotic fluid index.   PRENATAL LABORATORIES:  Blood type B+.  Antibody screen negative.  RPR  nonreactive.  Rubella immune.  Hepatitis B surface antigen negative.  HIV  nonreactive.  One-hour Glucola normal at 123.  28-week hemoglobin 11.8.  Group B beta Strep negative.  First trimester screen showed an increased  risk of Down syndrome at 1 in 32.  16-week AFP normal.  Patient declined  invasive testing.   ASSESSMENT:  A 50 year old gravida 7, para 5-0-1-5 white female at 37+ weeks  gestation by first trimester ultrasound with mild preeclampsia and history  of rapid labor for induction of labor.   PLAN:  1.  Indications and risks/benefits of induction reviewed with the patient      and husband in detail.  2.  Will admit to labor and delivery, check preeclampsia laboratories, check      serial blood pressures.  Continue fetal monitoring.  3.  Start low dose Pitocin, perform amniotomy when able.  4.  Epidural p.r.n.  5.  Monitor blood pressures closely.  Will start magnesium sulfate      prophylaxis if blood pressures remain elevated.  6.  Grand multiparity.  Will have Cytotec 800 mcg available in the room in      the event of postpartum hemorrhage.      Richardean Sale, M.D.  Electronically Signed     JW/MEDQ  D:  03/05/2005  T:  03/05/2005  Job:  [id number]

## 2010-09-01 NOTE — H&P (Signed)
NAME:  Margaret Paul, Margaret Paul NO.:  [id number]   MEDICAL RECORD NO.:  [id number]                   PATIENT TYPE:  IPS   LOCATION:  0403                                 [facility]ACILITY:  [facility]   PHYSICIAN:  Jeanice Lim, MD                DATE OF BIRTH:  1960-06-07   DATE OF ADMISSION:  12/18/2001  DATE OF DISCHARGE:                         PSYCHIATRIC ADMISSION ASSESSMENT   IDENTIFYING INFORMATION:  This is a 50 year old married white female  involuntarily committed on December 18, 2001.   HISTORY OF PRESENT ILLNESS:  The patient presents on papers.  Papers state  the patient has been anxious, agitated, confused, experiencing marked  paranoia and hallucinations, thinking that furniture has been moved and that  drugs have been put into her cigarettes.  She thought her children were  missing the day before admission.  She called the sheriff.  Children were  actually on her way to the school.  The patient, today, reports that she  wants to get better.  She wants her family back and she needs help.  Reports decreased sleep, decreased appetite with an unknown weight loss.  Reports having problems with focusing.  States that she feels cloudy.  She  feels people have come in and rearranged the furniture even though she  believes that may not be true.  She feels depressed.  She denies any  suicidal or homicidal ideation.  She reports she is under a lot of stress.  Her husband left her one week ago.   PAST PSYCHIATRIC HISTORY:  First hospitalization to [facility]  No other inpatient psychiatric admissions.  No outpatient  treatment.   SOCIAL HISTORY:  This is a 50 year old married white female, married for 15  years, second marriage.  Has five children, ages 31, 7, 31, 4 and 107.  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  She lives with her husband and children.  She  is a stay-at-home mom.   FAMILY HISTORY:  Unknown.   ALCOHOL/DRUG HISTORY:  The patient  smokes.  She denies any alcohol use.  She  states she used marijuana two months ago.   PRIMARY CARE PHYSICIAN:  Unknown.   MEDICAL PROBLEMS:  None.   MEDICATIONS:  None.   ALLERGIES:  No known allergies.   PHYSICAL EXAMINATION:  Performed at [facility].   LABORATORY DATA:  CMET with blood sugar 113.  SGPT 27.  CBC within normal  limits.  Acetaminophen level less than 2.  Salicylate level is 3.7.  Alcohol  level is 0.  Urinalysis was negative.  Urine drug screen was negative.   MENTAL STATUS EXAM:  She is an alert, young, middle-aged female.  Cooperative with good eye contact.  Speech is clear.  Mood is anxious and  depressed.  Affect is sad and anxious.  Thought processes are positive  paranoia, positive delusions.  No auditory  or visual hallucinations.  No  flight of ideas.  No suicidal or homicidal ideation.  Cognitive function  intact.  Memory is fair.  Judgment and insight are poor.   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  2. Rule out major depression with psychosis.   AXIS II:  Deferred.   AXIS III:  Status post closed head injury.   AXIS IV:  Problems with primary support group and other psychosocial  problems.   AXIS V:  Current 25; this past year 55-70.   PLAN:  Involuntary commitment for psychosis.  Contract for safety.  Check  every 15 minutes.  The patient to be placed on the [address].  Will obtain  labs.  Will initiate Risperdal for psychosis and Lexapro to decrease  depressive symptoms.  Have a family session to stabilize mood and thinking  so patient can be safe and functional.   TENTATIVE LENGTH OF STAY:  Four to six days or more depending on patient's  response to medications.     Landry Corporal, N.P.                       Jeanice Lim, MD    JO/MEDQ  D:  12/19/2001  T:  12/19/2001  Job:  [id number]

## 2010-09-01 NOTE — H&P (Signed)
NAME:  WAKEELAH, SOLAN NO.:  [id number]   MEDICAL RECORD NO.:  [id number]          PATIENT TYPE:  INP   LOCATION:  9170                          [facility]ILITY:  [facility]   PHYSICIAN:  Richardean Sale, M.D.   DATE OF BIRTH:  08-Mar-1961   DATE OF ADMISSION:  03/01/2005  DATE OF DISCHARGE:                                HISTORY & PHYSICAL   ADMITTING DIAGNOSES:  37-week intrauterine pregnancy with hypertension and  early labor.   HISTORY OF PRESENT ILLNESS:  This is a 50 year old gravida 7, para 5-0-1-5  white female who is at [redacted] weeks gestation by first trimester ultrasound   Dictation ended at this point.      Richardean Sale, M.D.  Electronically Signed     JW/MEDQ  D:  03/01/2005  T:  03/01/2005  Job:  [id number]

## 2011-11-02 ENCOUNTER — Encounter (HOSPITAL_COMMUNITY): Payer: Self-pay | Admitting: Licensed Clinical Social Worker

## 2013-03-08 ENCOUNTER — Ambulatory Visit: Payer: Self-pay

## 2013-03-08 NOTE — Telephone Encounter (Signed)
Message copied by Camillia Herter on Sun Mar 08, 2013  4:10 PM  ------       Message from: Elsie Lincoln, [place]       Created: Sun Mar 08, 2013  3:49 PM       Regarding: Severe pain in mouth         >> Jethro Bolus 03/08/2013 03:49 PM       Severe pain in mouth  ------

## 2013-03-09 ENCOUNTER — Telehealth (INDEPENDENT_AMBULATORY_CARE_PROVIDER_SITE_OTHER): Payer: Self-pay | Admitting: Oral and Maxillofacial Surgery

## 2013-03-09 NOTE — Telephone Encounter (Signed)
CONFIRMED PHONE NUMBER: [phone]  CALLERS FIRST AND LAST NAME: Jolin E Goheen  FACILITY NAME: NA TITLE: NA  CALLERS RELATIONSHIP:Self  RETURN CALL: General message on voicemail only     SUBJECT: General Message   REASON FOR REQUEST: Patient call to make appointment no referral on epic,she insist that her doctor has referral her,please call her back. Thank you    MESSAGE: [employer]

## 2013-03-18 ENCOUNTER — Other Ambulatory Visit (EMERGENCY_DEPARTMENT_HOSPITAL): Payer: Self-pay | Admitting: Emergency Medicine

## 2013-03-18 ENCOUNTER — Emergency Department (HOSPITAL_BASED_OUTPATIENT_CLINIC_OR_DEPARTMENT_OTHER)
Admission: EM | Admit: 2013-03-18 | Discharge: 2013-03-19 | Disposition: A | Discharge status: Home / Self Care | Payer: Medicare HMO | Attending: Emergency Medicine | Admitting: Emergency Medicine

## 2013-03-18 DIAGNOSIS — B9789 Other viral agents as the cause of diseases classified elsewhere: Secondary | ICD-10-CM | POA: Insufficient documentation

## 2013-03-18 DIAGNOSIS — R1110 Vomiting, unspecified: Secondary | ICD-10-CM | POA: Insufficient documentation

## 2013-03-18 DIAGNOSIS — R059 Cough, unspecified: Secondary | ICD-10-CM | POA: Insufficient documentation

## 2013-03-18 LAB — COMPREHENSIVE METABOLIC PANEL
ALT (GPT): 13 U/L (ref 6–40)
AST (GOT): 20 U/L (ref 15–40)
Albumin: 3.6 g/dL (ref 3.5–5.2)
Alkaline Phosphatase (Total): 141 U/L — ABNORMAL HIGH (ref 34–121)
Anion Gap: 10 (ref 3–11)
Bilirubin (Total): 1.3 mg/dL (ref 0.2–1.3)
Calcium: 9.2 mg/dL (ref 8.9–10.2)
Carbon Dioxide, Total: 24 mEq/L (ref 22–32)
Chloride: 101 mEq/L (ref 98–108)
Creatinine: 2.03 mg/dL — ABNORMAL HIGH (ref 0.38–1.02)
GFR, Calc, African American: 31 mL/min — ABNORMAL LOW (ref >59)
GFR, Calc, European American: 26 mL/min — ABNORMAL LOW (ref >59)
Glucose: 114 mg/dL (ref 62–125)
Potassium: 5 mEq/L (ref 3.7–5.2)
Protein (Total): 6.6 g/dL (ref 6.0–8.2)
Sodium: 135 mEq/L — ABNORMAL LOW (ref 136–145)
Urea Nitrogen: 47 mg/dL — ABNORMAL HIGH (ref 8–21)

## 2013-03-18 LAB — CBC, DIFF
% Basophils: 1 %
% Eosinophils: 1 %
% Immature Granulocytes: 0 %
% Lymphocytes: 24 %
% Monocytes: 8 %
% Neutrophils: 66 %
Absolute Eosinophil Count: 0.03 10*3/uL (ref 0.00–0.50)
Absolute Lymphocyte Count: 1.5 10*3/uL (ref 1.00–4.80)
Basophils: 0.03 10*3/uL (ref 0.00–0.20)
Hematocrit: 38 % (ref 36–45)
Hemoglobin: 12.2 g/dL (ref 11.5–15.5)
Immature Granulocytes: 0.01 10*3/uL (ref 0.00–0.05)
MCH: 26.5 pg — ABNORMAL LOW (ref 27.3–33.6)
MCHC: 31.9 g/dL — ABNORMAL LOW (ref 32.2–36.5)
MCV: 83 fL (ref 81–98)
Monocytes: 0.46 10*3/uL (ref 0.00–0.80)
Neutrophils: 4.14 10*3/uL (ref 1.80–7.00)
Platelet Count: 120 10*3/uL — ABNORMAL LOW (ref 150–400)
RBC: 4.6 mil/uL (ref 3.80–5.00)
RDW-CV: 21.9 % — ABNORMAL HIGH (ref 11.6–14.4)
WBC: 6.17 10*3/uL (ref 4.30–10.00)

## 2013-03-18 LAB — LAB ADD ON ORDER

## 2013-03-18 LAB — PROTHROMBIN & PTT
Partial Thromboplastin Time: 30 s (ref 22–35)
Prothrombin INR: 1.4 — ABNORMAL HIGH (ref 0.8–1.3)
Prothrombin Time Patient: 16.7 s — ABNORMAL HIGH (ref 10.7–15.6)

## 2013-03-18 LAB — TROPONIN_I
Troponin_I Interpretation: NORMAL
Troponin_I: 0.03 ng/mL (ref <0.04)

## 2013-03-18 LAB — LIPASE: Lipase: 31 U/L (ref 20–50)

## 2013-03-18 LAB — B_TYPE NATRIURETIC PEPTIDE: B_Type Natriuretic Peptide: 1562 pg/mL — ABNORMAL HIGH (ref <101)

## 2013-04-15 ENCOUNTER — Other Ambulatory Visit (EMERGENCY_DEPARTMENT_HOSPITAL): Payer: Self-pay | Admitting: Anesthesiology

## 2013-04-15 ENCOUNTER — Other Ambulatory Visit (EMERGENCY_DEPARTMENT_HOSPITAL): Payer: Self-pay | Admitting: Emergency Medicine

## 2013-04-15 ENCOUNTER — Inpatient Hospital Stay
Admission: EM | Admit: 2013-04-15 | Discharge: 2013-04-21 | DRG: 291 | Disposition: A | Discharge status: Home Health | Payer: Medicare HMO | Attending: Internal Medicine | Admitting: Internal Medicine

## 2013-04-15 ENCOUNTER — Inpatient Hospital Stay (HOSPITAL_COMMUNITY): Payer: Medicare HMO | Admitting: Internal Medicine

## 2013-04-15 DIAGNOSIS — Z950 Presence of cardiac pacemaker: Secondary | ICD-10-CM

## 2013-04-15 DIAGNOSIS — I252 Old myocardial infarction: Secondary | ICD-10-CM

## 2013-04-15 DIAGNOSIS — E669 Obesity, unspecified: Secondary | ICD-10-CM | POA: Diagnosis present

## 2013-04-15 DIAGNOSIS — I5189 Other ill-defined heart diseases: Secondary | ICD-10-CM | POA: Diagnosis present

## 2013-04-15 DIAGNOSIS — I509 Heart failure, unspecified: Secondary | ICD-10-CM | POA: Diagnosis present

## 2013-04-15 DIAGNOSIS — Z7901 Long term (current) use of anticoagulants: Secondary | ICD-10-CM

## 2013-04-15 DIAGNOSIS — Z91199 Patient's noncompliance with other medical treatment and regimen due to unspecified reason: Secondary | ICD-10-CM

## 2013-04-15 DIAGNOSIS — R627 Adult failure to thrive: Secondary | ICD-10-CM | POA: Diagnosis present

## 2013-04-15 DIAGNOSIS — R32 Unspecified urinary incontinence: Secondary | ICD-10-CM | POA: Diagnosis not present

## 2013-04-15 DIAGNOSIS — I959 Hypotension, unspecified: Secondary | ICD-10-CM | POA: Diagnosis present

## 2013-04-15 DIAGNOSIS — I2510 Atherosclerotic heart disease of native coronary artery without angina pectoris: Secondary | ICD-10-CM | POA: Diagnosis present

## 2013-04-15 DIAGNOSIS — R197 Diarrhea, unspecified: Secondary | ICD-10-CM | POA: Diagnosis present

## 2013-04-15 DIAGNOSIS — I69922 Dysarthria following unspecified cerebrovascular disease: Secondary | ICD-10-CM

## 2013-04-15 DIAGNOSIS — Z885 Allergy status to narcotic agent status: Secondary | ICD-10-CM

## 2013-04-15 DIAGNOSIS — I69919 Unspecified symptoms and signs involving cognitive functions following unspecified cerebrovascular disease: Secondary | ICD-10-CM

## 2013-04-15 DIAGNOSIS — I4891 Unspecified atrial fibrillation: Secondary | ICD-10-CM | POA: Diagnosis present

## 2013-04-15 DIAGNOSIS — G4733 Obstructive sleep apnea (adult) (pediatric): Secondary | ICD-10-CM | POA: Diagnosis present

## 2013-04-15 DIAGNOSIS — N183 Chronic kidney disease, stage 3 unspecified: Secondary | ICD-10-CM | POA: Diagnosis present

## 2013-04-15 DIAGNOSIS — N179 Acute kidney failure, unspecified: Secondary | ICD-10-CM | POA: Diagnosis present

## 2013-04-15 DIAGNOSIS — E46 Unspecified protein-calorie malnutrition: Secondary | ICD-10-CM | POA: Diagnosis present

## 2013-04-15 DIAGNOSIS — E785 Hyperlipidemia, unspecified: Secondary | ICD-10-CM | POA: Diagnosis present

## 2013-04-15 DIAGNOSIS — J4489 Other specified chronic obstructive pulmonary disease: Secondary | ICD-10-CM | POA: Diagnosis present

## 2013-04-15 DIAGNOSIS — Z8249 Family history of ischemic heart disease and other diseases of the circulatory system: Secondary | ICD-10-CM

## 2013-04-15 DIAGNOSIS — F411 Generalized anxiety disorder: Secondary | ICD-10-CM | POA: Diagnosis present

## 2013-04-15 DIAGNOSIS — I4949 Other premature depolarization: Secondary | ICD-10-CM | POA: Diagnosis present

## 2013-04-15 DIAGNOSIS — I428 Other cardiomyopathies: Secondary | ICD-10-CM | POA: Diagnosis present

## 2013-04-15 DIAGNOSIS — Z823 Family history of stroke: Secondary | ICD-10-CM

## 2013-04-15 DIAGNOSIS — E871 Hypo-osmolality and hyponatremia: Secondary | ICD-10-CM | POA: Diagnosis not present

## 2013-04-15 DIAGNOSIS — Z87891 Personal history of nicotine dependence: Secondary | ICD-10-CM

## 2013-04-15 DIAGNOSIS — E872 Acidosis, unspecified: Secondary | ICD-10-CM | POA: Diagnosis present

## 2013-04-15 DIAGNOSIS — I5023 Acute on chronic systolic (congestive) heart failure: Secondary | ICD-10-CM | POA: Diagnosis present

## 2013-04-15 LAB — LAB ADD ON ORDER

## 2013-04-15 LAB — COMPREHENSIVE METABOLIC PANEL
ALT (GPT): 22 U/L (ref 6–40)
AST (GOT): 54 U/L — ABNORMAL HIGH (ref 15–40)
Albumin: 3.2 g/dL — ABNORMAL LOW (ref 3.5–5.2)
Alkaline Phosphatase (Total): 135 U/L — ABNORMAL HIGH (ref 34–121)
Anion Gap: 9 (ref 3–11)
Bilirubin (Total): 1.9 mg/dL — ABNORMAL HIGH (ref 0.2–1.3)
Calcium: 8.7 mg/dL — ABNORMAL LOW (ref 8.9–10.2)
Carbon Dioxide, Total: 19 mEq/L — ABNORMAL LOW (ref 22–32)
Chloride: 104 mEq/L (ref 98–108)
Creatinine: 1.7 mg/dL — ABNORMAL HIGH (ref 0.38–1.02)
GFR, Calc, African American: 39 mL/min — ABNORMAL LOW (ref >59)
GFR, Calc, European American: 32 mL/min — ABNORMAL LOW (ref >59)
Glucose: 121 mg/dL (ref 62–125)
Potassium: 4.7 mEq/L (ref 3.7–5.2)
Protein (Total): 6.2 g/dL (ref 6.0–8.2)
Sodium: 132 mEq/L — ABNORMAL LOW (ref 136–145)
Urea Nitrogen: 37 mg/dL — ABNORMAL HIGH (ref 8–21)

## 2013-04-15 LAB — CREATININE
Creatinine: 1.83 mg/dL — ABNORMAL HIGH (ref 0.38–1.02)
GFR, Calc, African American: 35 mL/min — ABNORMAL LOW (ref >59)
GFR, Calc, European American: 29 mL/min — ABNORMAL LOW (ref >59)

## 2013-04-15 LAB — CBC, DIFF
% Basophils: 1 %
% Eosinophils: 1 %
% Immature Granulocytes: 0 %
% Lymphocytes: 46 %
% Monocytes: 14 %
% Neutrophils: 38 %
Absolute Eosinophil Count: 0.03 10*3/uL (ref 0.00–0.50)
Absolute Lymphocyte Count: 2.07 10*3/uL (ref 1.00–4.80)
Basophils: 0.03 10*3/uL (ref 0.00–0.20)
Hematocrit: 37 % (ref 36–45)
Hemoglobin: 11.5 g/dL (ref 11.5–15.5)
Immature Granulocytes: 0.01 10*3/uL (ref 0.00–0.05)
MCH: 25.6 pg — ABNORMAL LOW (ref 27.3–33.6)
MCHC: 31.5 g/dL — ABNORMAL LOW (ref 32.2–36.5)
MCV: 81 fL (ref 81–98)
Monocytes: 0.63 10*3/uL (ref 0.00–0.80)
Neutrophils: 1.7 10*3/uL — ABNORMAL LOW (ref 1.80–7.00)
Platelet Count: 228 10*3/uL (ref 150–400)
RBC: 4.49 mil/uL (ref 3.80–5.00)
RDW-CV: 20.8 % — ABNORMAL HIGH (ref 11.6–14.4)
WBC: 4.47 10*3/uL (ref 4.3–10.0)

## 2013-04-15 LAB — B_TYPE NATRIURETIC PEPTIDE: B_Type Natriuretic Peptide: 2337 pg/mL — ABNORMAL HIGH (ref <101)

## 2013-04-15 LAB — EMERGENCY DRUG SCREEN, URN
Amphetamine Qual, Urine: NEGATIVE
Barbiturate Qual, Urine: NEGATIVE
Benzodiazepines Qual, Urine: NEGATIVE
Cocaine Qual, Urine: NEGATIVE
Methadone Qual, Urine: NEGATIVE
Opiates Qual, Urine: NEGATIVE
Phencyclidine Qual, Urine: NEGATIVE
Tricyclic Antidepressants, Urine: NEGATIVE

## 2013-04-15 LAB — URINALYSIS COMPLETE, URN
Bacteria, URN: NONE SEEN
Bilirubin (Qual), URN: NEGATIVE
Epith Cells_Renal/Trans,URN: NEGATIVE /HPF
Glucose Qual, URN: NEGATIVE mg/dL
Ketones, URN: NEGATIVE mg/dL
Leukocyte Esterase, URN: NEGATIVE
Nitrite, URN: NEGATIVE
Occult Blood, URN: NEGATIVE
RBC, URN: NEGATIVE /HPF
Specific Gravity, URN: 1.023 g/mL (ref 1.002–1.027)
WBC, URN: NEGATIVE /HPF

## 2013-04-15 LAB — ALCOHOL (ETHYL): Alcohol (Ethyl): NEGATIVE mg/dL

## 2013-04-15 LAB — LIPASE: Lipase: 21 U/L (ref 20–50)

## 2013-04-15 LAB — PROTHROMBIN & PTT
Partial Thromboplastin Time: 30 s (ref 22–35)
Prothrombin INR: 2.1 — ABNORMAL HIGH (ref 0.8–1.3)
Prothrombin Time Patient: 23.2 s — ABNORMAL HIGH (ref 10.7–15.6)

## 2013-04-15 LAB — 1ST EXTRA RED TOP

## 2013-04-15 LAB — L LACTATE, VENOUS PLASMA (TO U/H LAB > 30 MIN): L Lactate (Indirect), VEN: 1.7 mmol/L (ref 0.6–2.5)

## 2013-04-15 LAB — CHEST PAIN REFLEXIVE TESTING
Troponin_I Interpretation: ELEVATED
Troponin_I: 0.12 ng/mL — ABNORMAL HIGH (ref <0.04)

## 2013-04-15 LAB — TROPONIN_I
Troponin_I Interpretation: ELEVATED
Troponin_I: 0.1 ng/mL — ABNORMAL HIGH (ref <0.04)

## 2013-04-15 LAB — 1ST EXTRA GRAY TOP

## 2013-04-16 ENCOUNTER — Other Ambulatory Visit (HOSPITAL_COMMUNITY): Payer: Self-pay | Admitting: Physical Medicine & Rehabilitation

## 2013-04-16 ENCOUNTER — Other Ambulatory Visit (HOSPITAL_COMMUNITY): Payer: Self-pay | Admitting: Hospice and Palliative Medicine

## 2013-04-16 ENCOUNTER — Other Ambulatory Visit (HOSPITAL_COMMUNITY): Payer: Self-pay | Admitting: Internal Medicine

## 2013-04-16 ENCOUNTER — Other Ambulatory Visit (EMERGENCY_DEPARTMENT_HOSPITAL): Payer: Self-pay | Admitting: Internal Medicine

## 2013-04-16 DIAGNOSIS — R079 Chest pain, unspecified: Secondary | ICD-10-CM

## 2013-04-16 DIAGNOSIS — E872 Acidosis, unspecified: Secondary | ICD-10-CM

## 2013-04-16 DIAGNOSIS — R29810 Facial weakness: Secondary | ICD-10-CM

## 2013-04-16 DIAGNOSIS — I509 Heart failure, unspecified: Secondary | ICD-10-CM

## 2013-04-16 DIAGNOSIS — R471 Dysarthria and anarthria: Secondary | ICD-10-CM

## 2013-04-16 DIAGNOSIS — Z8679 Personal history of other diseases of the circulatory system: Secondary | ICD-10-CM

## 2013-04-16 DIAGNOSIS — J810 Acute pulmonary edema: Secondary | ICD-10-CM

## 2013-04-16 LAB — BASIC METABOLIC PANEL
Anion Gap: 9 (ref 3–11)
Anion Gap: 8 (ref 3–11)
Calcium: 8.4 mg/dL — ABNORMAL LOW (ref 8.9–10.2)
Calcium: 8.6 mg/dL — ABNORMAL LOW (ref 8.9–10.2)
Carbon Dioxide, Total: 18 mEq/L — ABNORMAL LOW (ref 22–32)
Carbon Dioxide, Total: 18 mEq/L — ABNORMAL LOW (ref 22–32)
Chloride: 106 mEq/L (ref 98–108)
Chloride: 106 mEq/L (ref 98–108)
Creatinine: 1.95 mg/dL — ABNORMAL HIGH (ref 0.38–1.02)
Creatinine: 1.96 mg/dL — ABNORMAL HIGH (ref 0.38–1.02)
GFR, Calc, African American: 33 mL/min — ABNORMAL LOW (ref >59)
GFR, Calc, African American: 33 mL/min — ABNORMAL LOW (ref >59)
GFR, Calc, European American: 27 mL/min — ABNORMAL LOW (ref >59)
GFR, Calc, European American: 27 mL/min — ABNORMAL LOW (ref >59)
Glucose: 128 mg/dL — ABNORMAL HIGH (ref 62–125)
Glucose: 146 mg/dL — ABNORMAL HIGH (ref 62–125)
Potassium: 4.7 mEq/L (ref 3.7–5.2)
Potassium: 4.4 mEq/L (ref 3.7–5.2)
Sodium: 133 mEq/L — ABNORMAL LOW (ref 136–145)
Sodium: 132 mEq/L — ABNORMAL LOW (ref 136–145)
Urea Nitrogen: 38 mg/dL — ABNORMAL HIGH (ref 8–21)
Urea Nitrogen: 40 mg/dL — ABNORMAL HIGH (ref 8–21)

## 2013-04-16 LAB — CORTISOL: Cortisol: 17.1 ug/dL

## 2013-04-16 LAB — LAB ADD ON ORDER

## 2013-04-16 LAB — URIC ACID, URINE: Uric Acid, URN: 53 mg/dL

## 2013-04-16 LAB — TROPONIN_I
Troponin_I Interpretation: ELEVATED
Troponin_I: 0.1 ng/mL — ABNORMAL HIGH (ref <0.04)

## 2013-04-16 LAB — CREATININE, URINE: Creatinine/Unit, Urine: 230 mg/dL

## 2013-04-16 LAB — CBC (HEMOGRAM)
Hematocrit: 37 % (ref 36–45)
Hemoglobin: 11.5 g/dL (ref 11.5–15.5)
MCH: 25.3 pg — ABNORMAL LOW (ref 27.3–33.6)
MCHC: 31.5 g/dL — ABNORMAL LOW (ref 32.2–36.5)
MCV: 80 fL — ABNORMAL LOW (ref 81–98)
Platelet Count: 206 10*3/uL (ref 150–400)
RBC: 4.54 mil/uL (ref 3.80–5.00)
RDW-CV: 20.7 % — ABNORMAL HIGH (ref 11.6–14.4)
WBC: 4.5 10*3/uL (ref 4.3–10.0)

## 2013-04-16 LAB — SED RATE: Erythrocyte Sedimentation Rate: 7 mm/hr (ref 0–20)

## 2013-04-16 LAB — CK, CREATINE KINASE, TOTAL ACTIVITY: Creatine Kinase Total Activity: 822 U/L — ABNORMAL HIGH (ref 30–231)

## 2013-04-16 LAB — ELECTROLYTES, URN
Chloride, URN: 16 mEq/L
Potassium, URN: 57 mEq/L
Sodium, URN: <10 mEq/L

## 2013-04-16 LAB — CK MB MASS: CK MB Mass: 9 ng/mL — ABNORMAL HIGH (ref 0–5)

## 2013-04-16 LAB — MAGNESIUM
Magnesium: 1.8 mg/dL (ref 1.8–2.4)
Magnesium: 1.7 mg/dL — ABNORMAL LOW (ref 1.8–2.4)

## 2013-04-16 LAB — THYROID STIMULATING HORMONE: Thyroid Stimulating Hormone: 3.289 u[IU]/mL (ref 0.400–5.000)

## 2013-04-17 ENCOUNTER — Other Ambulatory Visit (HOSPITAL_COMMUNITY): Payer: Self-pay

## 2013-04-17 ENCOUNTER — Other Ambulatory Visit (HOSPITAL_COMMUNITY): Payer: Self-pay | Admitting: Hospice and Palliative Medicine

## 2013-04-17 ENCOUNTER — Other Ambulatory Visit (HOSPITAL_COMMUNITY): Payer: Self-pay | Admitting: Internal Medicine

## 2013-04-17 ENCOUNTER — Ambulatory Visit (HOSPITAL_BASED_OUTPATIENT_CLINIC_OR_DEPARTMENT_OTHER): Payer: Medicare HMO

## 2013-04-17 DIAGNOSIS — I509 Heart failure, unspecified: Secondary | ICD-10-CM

## 2013-04-17 DIAGNOSIS — I5189 Other ill-defined heart diseases: Secondary | ICD-10-CM

## 2013-04-17 DIAGNOSIS — I5023 Acute on chronic systolic (congestive) heart failure: Secondary | ICD-10-CM

## 2013-04-17 DIAGNOSIS — N189 Chronic kidney disease, unspecified: Secondary | ICD-10-CM

## 2013-04-17 DIAGNOSIS — I6350 Cerebral infarction due to unspecified occlusion or stenosis of unspecified cerebral artery: Secondary | ICD-10-CM

## 2013-04-17 DIAGNOSIS — N179 Acute kidney failure, unspecified: Secondary | ICD-10-CM

## 2013-04-17 LAB — MAGNESIUM: Magnesium: 1.7 mg/dL — ABNORMAL LOW (ref 1.8–2.4)

## 2013-04-17 LAB — BASIC METABOLIC PANEL
Anion Gap: 9 (ref 3–11)
Calcium: 8.6 mg/dL — ABNORMAL LOW (ref 8.9–10.2)
Carbon Dioxide, Total: 19 mEq/L — ABNORMAL LOW (ref 22–32)
Chloride: 106 mEq/L (ref 98–108)
Creatinine: 2.17 mg/dL — ABNORMAL HIGH (ref 0.38–1.02)
GFR, Calc, African American: 29 mL/min — ABNORMAL LOW (ref >59)
GFR, Calc, European American: 24 mL/min — ABNORMAL LOW (ref >59)
Glucose: 155 mg/dL — ABNORMAL HIGH (ref 62–125)
Potassium: 4.2 mEq/L (ref 3.7–5.2)
Sodium: 134 mEq/L — ABNORMAL LOW (ref 136–145)
Urea Nitrogen: 42 mg/dL — ABNORMAL HIGH (ref 8–21)

## 2013-04-17 LAB — PROTHROMBIN & PTT
Partial Thromboplastin Time: 38 s — ABNORMAL HIGH (ref 22–35)
Prothrombin INR: 2.8 — ABNORMAL HIGH (ref 0.8–1.3)
Prothrombin Time Patient: 29.2 s — ABNORMAL HIGH (ref 10.7–15.6)

## 2013-04-17 LAB — LIPID PANEL
Cholesterol (LDL): 62 mg/dL (ref <130)
Cholesterol/HDL Ratio: 4
HDL Cholesterol: 24 mg/dL — ABNORMAL LOW (ref >40)
Non-HDL Cholesterol: 73 mg/dL (ref 0–159)
Total Cholesterol: 97 mg/dL (ref <200)
Triglyceride: 53 mg/dL (ref <150)

## 2013-04-17 LAB — CORTISOL
Cortisol: 16.4 ug/dL
Cortisol: 36.5 ug/dL
Stimulation Interval, Crt: 60

## 2013-04-17 LAB — CBC (HEMOGRAM)
Hematocrit: 34 % — ABNORMAL LOW (ref 36–45)
Hemoglobin: 11.3 g/dL — ABNORMAL LOW (ref 11.5–15.5)
MCH: 26.3 pg — ABNORMAL LOW (ref 27.3–33.6)
MCHC: 32.8 g/dL (ref 32.2–36.5)
MCV: 80 fL — ABNORMAL LOW (ref 81–98)
Platelet Count: 176 10*3/uL (ref 150–400)
RBC: 4.29 mil/uL (ref 3.80–5.00)
RDW-CV: 20.7 % — ABNORMAL HIGH (ref 11.6–14.4)
WBC: 3.84 10*3/uL — ABNORMAL LOW (ref 4.3–10.0)

## 2013-04-18 ENCOUNTER — Other Ambulatory Visit (HOSPITAL_COMMUNITY): Payer: Self-pay | Admitting: Internal Medicine

## 2013-04-18 ENCOUNTER — Other Ambulatory Visit (HOSPITAL_COMMUNITY): Payer: Self-pay | Admitting: Hospice and Palliative Medicine

## 2013-04-18 DIAGNOSIS — E871 Hypo-osmolality and hyponatremia: Secondary | ICD-10-CM

## 2013-04-18 DIAGNOSIS — N183 Chronic kidney disease, stage 3 unspecified: Secondary | ICD-10-CM

## 2013-04-18 LAB — CBC (HEMOGRAM)
Hematocrit: 37 % (ref 36–45)
Hemoglobin: 11.6 g/dL (ref 11.5–15.5)
MCH: 24.9 pg — ABNORMAL LOW (ref 27.3–33.6)
MCHC: 31.2 g/dL — ABNORMAL LOW (ref 32.2–36.5)
MCV: 80 fL — ABNORMAL LOW (ref 81–98)
Platelet Count: 190 10*3/uL (ref 150–400)
RBC: 4.65 mil/uL (ref 3.80–5.00)
RDW-CV: 20.6 % — ABNORMAL HIGH (ref 11.6–14.4)
WBC: 4.73 10*3/uL (ref 4.3–10.0)

## 2013-04-18 LAB — BASIC METABOLIC PANEL
Anion Gap: 8 (ref 3–11)
Anion Gap: 9 (ref 3–11)
Calcium: 8.6 mg/dL — ABNORMAL LOW (ref 8.9–10.2)
Calcium: 8.7 mg/dL — ABNORMAL LOW (ref 8.9–10.2)
Carbon Dioxide, Total: 21 mEq/L — ABNORMAL LOW (ref 22–32)
Carbon Dioxide, Total: 21 mEq/L — ABNORMAL LOW (ref 22–32)
Chloride: 106 mEq/L (ref 98–108)
Chloride: 103 mEq/L (ref 98–108)
Creatinine: 1.88 mg/dL — ABNORMAL HIGH (ref 0.38–1.02)
Creatinine: 1.8 mg/dL — ABNORMAL HIGH (ref 0.38–1.02)
GFR, Calc, African American: 34 mL/min — ABNORMAL LOW (ref >59)
GFR, Calc, African American: 36 mL/min — ABNORMAL LOW (ref >59)
GFR, Calc, European American: 28 mL/min — ABNORMAL LOW (ref >59)
GFR, Calc, European American: 30 mL/min — ABNORMAL LOW (ref >59)
Glucose: 129 mg/dL — ABNORMAL HIGH (ref 62–125)
Glucose: 142 mg/dL — ABNORMAL HIGH (ref 62–125)
Potassium: 3.8 mEq/L (ref 3.7–5.2)
Potassium: 4.3 mEq/L (ref 3.7–5.2)
Sodium: 135 mEq/L — ABNORMAL LOW (ref 136–145)
Sodium: 133 mEq/L — ABNORMAL LOW (ref 136–145)
Urea Nitrogen: 38 mg/dL — ABNORMAL HIGH (ref 8–21)
Urea Nitrogen: 37 mg/dL — ABNORMAL HIGH (ref 8–21)

## 2013-04-18 LAB — MAGNESIUM
Magnesium: 1.7 mg/dL — ABNORMAL LOW (ref 1.8–2.4)
Magnesium: 1.6 mg/dL — ABNORMAL LOW (ref 1.8–2.4)

## 2013-04-18 LAB — LAB ADD ON ORDER

## 2013-04-18 LAB — PROTHROMBIN TIME

## 2013-04-18 LAB — PROTHROMBIN & PTT
Partial Thromboplastin Time: 38 s — ABNORMAL HIGH (ref 22–35)
Prothrombin INR: 2.8 — ABNORMAL HIGH (ref 0.8–1.3)
Prothrombin Time Patient: 29.3 s — ABNORMAL HIGH (ref 10.7–15.6)

## 2013-04-19 ENCOUNTER — Other Ambulatory Visit (HOSPITAL_COMMUNITY): Payer: Self-pay | Admitting: Internal Medicine

## 2013-04-19 DIAGNOSIS — Z7901 Long term (current) use of anticoagulants: Secondary | ICD-10-CM

## 2013-04-19 DIAGNOSIS — I2510 Atherosclerotic heart disease of native coronary artery without angina pectoris: Secondary | ICD-10-CM

## 2013-04-19 LAB — BASIC METABOLIC PANEL
Anion Gap: 12 — ABNORMAL HIGH (ref 3–11)
Anion Gap: 9 (ref 3–11)
Calcium: 8.7 mg/dL — ABNORMAL LOW (ref 8.9–10.2)
Calcium: 8.7 mg/dL — ABNORMAL LOW (ref 8.9–10.2)
Carbon Dioxide, Total: 17 mEq/L — ABNORMAL LOW (ref 22–32)
Carbon Dioxide, Total: 22 mEq/L (ref 22–32)
Chloride: 105 mEq/L (ref 98–108)
Chloride: 102 mEq/L (ref 98–108)
Creatinine: 2.06 mg/dL — ABNORMAL HIGH (ref 0.38–1.02)
Creatinine: 2.39 mg/dL — ABNORMAL HIGH (ref 0.38–1.02)
GFR, Calc, African American: 31 mL/min — ABNORMAL LOW (ref >59)
GFR, Calc, African American: 26 mL/min — ABNORMAL LOW (ref >59)
GFR, Calc, European American: 25 mL/min — ABNORMAL LOW (ref >59)
GFR, Calc, European American: 21 mL/min — ABNORMAL LOW (ref >59)
Glucose: 105 mg/dL (ref 62–125)
Glucose: 157 mg/dL — ABNORMAL HIGH (ref 62–125)
Potassium: 4.9 mEq/L (ref 3.7–5.2)
Potassium: 4.1 mEq/L (ref 3.7–5.2)
Sodium: 134 mEq/L — ABNORMAL LOW (ref 136–145)
Sodium: 133 mEq/L — ABNORMAL LOW (ref 136–145)
Urea Nitrogen: 42 mg/dL — ABNORMAL HIGH (ref 8–21)
Urea Nitrogen: 43 mg/dL — ABNORMAL HIGH (ref 8–21)

## 2013-04-19 LAB — CBC (HEMOGRAM)

## 2013-04-19 LAB — PROTHROMBIN & PTT
Partial Thromboplastin Time: 37 s — ABNORMAL HIGH (ref 22–35)
Prothrombin INR: 2.5 — ABNORMAL HIGH (ref 0.8–1.3)
Prothrombin Time Patient: 26.7 s — ABNORMAL HIGH (ref 10.7–15.6)

## 2013-04-19 LAB — PROTHROMBIN TIME

## 2013-04-19 LAB — 1ST EXTRA LAVENDER TOP

## 2013-04-19 LAB — MAGNESIUM
Magnesium: 1.8 mg/dL (ref 1.8–2.4)
Magnesium: 1.7 mg/dL — ABNORMAL LOW (ref 1.8–2.4)

## 2013-04-20 ENCOUNTER — Other Ambulatory Visit (HOSPITAL_COMMUNITY): Payer: Self-pay | Admitting: Internal Medicine

## 2013-04-20 DIAGNOSIS — R9431 Abnormal electrocardiogram [ECG] [EKG]: Secondary | ICD-10-CM

## 2013-04-20 LAB — PROTHROMBIN TIME
Prothrombin INR: 2.4 — ABNORMAL HIGH (ref 0.8–1.3)
Prothrombin Time Patient: 26.1 s — ABNORMAL HIGH (ref 10.7–15.6)

## 2013-04-20 LAB — BASIC METABOLIC PANEL
Anion Gap: 8 (ref 3–11)
Calcium: 8.6 mg/dL — ABNORMAL LOW (ref 8.9–10.2)
Carbon Dioxide, Total: 22 mEq/L (ref 22–32)
Chloride: 103 mEq/L (ref 98–108)
Creatinine: 2.47 mg/dL — ABNORMAL HIGH (ref 0.38–1.02)
GFR, Calc, African American: 25 mL/min — ABNORMAL LOW (ref >59)
GFR, Calc, European American: 21 mL/min — ABNORMAL LOW (ref >59)
Glucose: 112 mg/dL (ref 62–125)
Potassium: 4.2 mEq/L (ref 3.7–5.2)
Sodium: 133 mEq/L — ABNORMAL LOW (ref 136–145)
Urea Nitrogen: 48 mg/dL — ABNORMAL HIGH (ref 8–21)

## 2013-04-20 LAB — MAGNESIUM: Magnesium: 1.7 mg/dL — ABNORMAL LOW (ref 1.8–2.4)

## 2013-04-21 ENCOUNTER — Other Ambulatory Visit (HOSPITAL_COMMUNITY): Payer: Self-pay | Admitting: Internal Medicine

## 2013-04-21 ENCOUNTER — Other Ambulatory Visit (HOSPITAL_COMMUNITY): Payer: Self-pay | Admitting: Hospice and Palliative Medicine

## 2013-04-21 DIAGNOSIS — G3184 Mild cognitive impairment, so stated: Secondary | ICD-10-CM

## 2013-04-21 DIAGNOSIS — Z8673 Personal history of transient ischemic attack (TIA), and cerebral infarction without residual deficits: Secondary | ICD-10-CM

## 2013-04-21 LAB — BASIC METABOLIC PANEL
Anion Gap: 10 (ref 3–11)
Calcium: 8.8 mg/dL — ABNORMAL LOW (ref 8.9–10.2)
Carbon Dioxide, Total: 23 mEq/L (ref 22–32)
Chloride: 102 mEq/L (ref 98–108)
Creatinine: 2.17 mg/dL — ABNORMAL HIGH (ref 0.38–1.02)
GFR, Calc, African American: 29 mL/min — ABNORMAL LOW (ref >59)
GFR, Calc, European American: 24 mL/min — ABNORMAL LOW (ref >59)
Glucose: 165 mg/dL — ABNORMAL HIGH (ref 62–125)
Potassium: 4.3 mEq/L (ref 3.7–5.2)
Sodium: 135 mEq/L — ABNORMAL LOW (ref 136–145)
Urea Nitrogen: 50 mg/dL — ABNORMAL HIGH (ref 8–21)

## 2013-04-21 LAB — PROTHROMBIN TIME
Prothrombin INR: 2.3 — ABNORMAL HIGH (ref 0.8–1.3)
Prothrombin Time Patient: 24.9 s — ABNORMAL HIGH (ref 10.7–15.6)

## 2013-04-21 LAB — MAGNESIUM: Magnesium: 1.7 mg/dL — ABNORMAL LOW (ref 1.8–2.4)

## 2013-04-23 ENCOUNTER — Telehealth (HOSPITAL_COMMUNITY): Payer: Self-pay

## 2013-04-23 NOTE — Telephone Encounter (Signed)
This is a 53 year old woman who was recently hospitalized for acute on chronic systolic heart failure, AKI probably cardiorenal syndrome, LV and RV mural thrombi, cardioembolic ischemic stroke, cognitive impairment.     Spoke with Martie LeeSabrina from [facility] who is following patient and extended my services for further support for care coordination as this patient is a high risk for readmission.  Martie LeeSabrina confirmed that she received d/c summary with upcoming appointments and needs.  I gave her my contact information if further assistance is needed.  She stated not needing further assistance at this time.    I will discontinue follow up calls at this time and available for assistance as needed as she has an involved case worker in the community.      This patient is a high risk for readmission due to limited insight and poor judgement regarding her medical needs, history of non adherence to medications/diet/follow up appointments, limited resources, and poor social support.     Vitals (Most recent and 24 hour range.)   Date Result Last MIN   -  MAX  04/21/13 16:00 Temp C:  36.5 36.2   -  36.5  04/21/13 16:00 HR:  67 62   -  67  04/21/13 16:00 RR:  20 16   -  20  04/21/13 16:00 SBP Non-Inv:  103 103   -  108  04/21/13 16:00 DBP Non-Inv:  71 71   -  73    Disposition:  [_] Home   [_] Skilled Nursing Facility   [X]  Other: home with home RN    Clinical Follow-up, including appointments:  - [facility] on 04/23/13 at 1245 for INR check and warfarin redosing  - PCP followup with Dr. Renne CriglerSamavedi on 1/15 at 1145  - Cardiology follow up with Dr. Anselm Junglinghoudhury on 1/19 at 345pm    Therapeutic Recommendations:  - Weigh yourself daily for monitoring of your congestive heart failure  - Restart losartan once kidney function improves and as indicated by cardiologist or PCP (baseline Cr from outside notes appears to be 1.8)  - Restart lasix when you return home  - Restart losartan when directed by your  cardiologist   - Take your warfarin every day as directed by anti-coagulation clinic  - Work with housing authority to establish permanent housing/address    Discharge weight: 91.8 kg, 04/18/2013 04:07, source: Standing (patient refused daily weight day of discharge)  Target dry weight: 90-92kg    Discharge Medications:  1. albuterol 90 mcg/inh inhalation aerosol    Dose: 2 puff(s) Inhalation Q4 Hours PRN for 30 day(s)  2. carVEDilol (carvedilol 3.125 mg oral tablet)    Dose: 3.125 mg PO Q12 Hours for 30 day(s)  3. docusate sodium 100 mg oral capsule    Dose: 200 mg PO BID for 30 day(s)  4. spironolactone 25 mg oral tablet    Dose: 12.5 mg PO Daily <-- new medication  5. warfarin 3 mg oral tablet    Dose: 1.5 mg PO QHS <--dose adjustment  6. acetaminophen 325 mg oral tablet    Dose: 650 mg PO Q4 Hours PRN Pain/Fever for 30 day(s)  7. melatonin 3 mg oral tablet    Dose: 3 mg PO QHS PRN Insomnia  8. nitroGLYCERIN (nitroglycerin 0.4 mg sublingual tablet)    Dose: 0.4 mg Sublingual Q5 Minutes PRN Chest Pain  9. ondansetron 4 mg oral tablet    Dose: 4-8 mg PO Q8 Hours PRN Nausea  10. Lasix 80mg  daily     Held Losartan          Tobie Lords, RN  Heart Failure Transition Nurse  [employer] [facility]  Office: [phone], Pager: [phone]  [email or web address]

## 2013-05-17 DEATH — deceased

## 2019-01-06 ENCOUNTER — Other Ambulatory Visit: Payer: Self-pay

## 2019-01-06 ENCOUNTER — Ambulatory Visit: Payer: Medicaid Other | Admitting: Family Medicine

## 2019-01-06 ENCOUNTER — Encounter (INDEPENDENT_AMBULATORY_CARE_PROVIDER_SITE_OTHER): Payer: Self-pay

## 2019-01-06 ENCOUNTER — Encounter: Payer: Self-pay | Admitting: Family Medicine

## 2019-01-06 VITALS — BP 120/76 | HR 78 | Temp 98.1°F | Resp 12 | Ht 61.0 in | Wt 172.0 lb

## 2019-01-06 DIAGNOSIS — Z114 Encounter for screening for human immunodeficiency virus [HIV]: Secondary | ICD-10-CM

## 2019-01-06 DIAGNOSIS — R5383 Other fatigue: Secondary | ICD-10-CM | POA: Diagnosis not present

## 2019-01-06 DIAGNOSIS — Z1159 Encounter for screening for other viral diseases: Secondary | ICD-10-CM

## 2019-01-06 DIAGNOSIS — Z1239 Encounter for other screening for malignant neoplasm of breast: Secondary | ICD-10-CM

## 2019-01-06 DIAGNOSIS — Z124 Encounter for screening for malignant neoplasm of cervix: Secondary | ICD-10-CM

## 2019-01-06 DIAGNOSIS — Z23 Encounter for immunization: Secondary | ICD-10-CM | POA: Diagnosis not present

## 2019-01-06 DIAGNOSIS — R030 Elevated blood-pressure reading, without diagnosis of hypertension: Secondary | ICD-10-CM

## 2019-01-06 DIAGNOSIS — E559 Vitamin D deficiency, unspecified: Secondary | ICD-10-CM

## 2019-01-06 DIAGNOSIS — E669 Obesity, unspecified: Secondary | ICD-10-CM

## 2019-01-06 NOTE — Patient Instructions (Addendum)
Thank you for coming into the office today. I appreciate the opportunity to provide you with the care for your health and wellness. Today we discussed: establish care  Follow up: 6 months  Labs ordered-fasting Wednesday morning  Homework: 1- work to include more veggies each meal 2- work to find some way to drink water 3- smoke one less cigarette a day 4- talk to [facility] Dr and ask about Wellbutrin for helping to stop smoking and help with depression 5- walk daily 30 minutes to an hour  CONGRATULATIONS on making the step to stop smoking! I wish you a lot of success on this.  Please continue to practice social distancing to keep you, your family, and our community safe.  If you must go out, please wear a Mask and practice good handwashing.  [place] YOUR HANDS WELL AND FREQUENTLY. AVOID TOUCHING YOUR FACE, UNLESS YOUR HANDS ARE FRESHLY WASHED.  GET FRESH AIR DAILY. STAY HYDRATED WITH WATER.   It was a pleasure to see you and I look forward to continuing to work together on your health and well-being. Please do not hesitate to call the office if you need care or have questions about your care.  Have a wonderful day and week. With Gratitude, Cherly Beach, DNP, AGNP-BC   Coping with Quitting Smoking  Quitting smoking is a physical and mental challenge. You will face cravings, withdrawal symptoms, and temptation. Before quitting, work with your health care provider to make a plan that can help you cope. Preparation can help you quit and keep you from giving in. How can I cope with cravings? Cravings usually last for 5-10 minutes. If you get through it, the craving will pass. Consider taking the following actions to help you cope with cravings:  Keep your mouth busy: ? Chew sugar-free gum. ? Suck on hard candies or a straw. ? Brush your teeth.  Keep your hands and body busy: ? Immediately change to a different activity when you feel a craving. ? Squeeze or play with a ball.  ? Do an activity or a hobby, like making bead jewelry, practicing needlepoint, or working with wood. ? Mix up your normal routine. ? Take a short exercise break. Go for a quick walk or run up and down stairs. ? Spend time in public places where smoking is not allowed.  Focus on doing something kind or helpful for someone else.  Call a friend or family member to talk during a craving.  Join a support group.  Call a quit line, such as 1-800-QUIT-NOW.  Talk with your health care provider about medicines that might help you cope with cravings and make quitting easier for you. How can I deal with withdrawal symptoms? Your body may experience negative effects as it tries to get used to not having nicotine in the system. These effects are called withdrawal symptoms. They may include:  Feeling hungrier than normal.  Trouble concentrating.  Irritability.  Trouble sleeping.  Feeling depressed.  Restlessness and agitation.  Craving a cigarette. To manage withdrawal symptoms:  Avoid places, people, and activities that trigger your cravings.  Remember why you want to quit.  Get plenty of sleep.  Avoid coffee and other caffeinated drinks. These may worsen some of your symptoms. How can I handle social situations? Social situations can be difficult when you are quitting smoking, especially in the first few weeks. To manage this, you can:  Avoid parties, bars, and other social situations where people might be smoking.  Avoid alcohol.  Leave right away if you have the urge to smoke.  Explain to your family and friends that you are quitting smoking. Ask for understanding and support.  Plan activities with friends or family where smoking is not an option. What are some ways I can cope with stress? Wanting to smoke may cause stress, and stress can make you want to smoke. Find ways to manage your stress. Relaxation techniques can help. For example:  Breathe slowly and deeply, in  through your nose and out through your mouth.  Listen to soothing, relaxing music.  Talk with a family member or friend about your stress.  Light a candle.  Soak in a bath or take a shower.  Think about a peaceful place. What are some ways I can prevent weight gain? Be aware that many people gain weight after they quit smoking. However, not everyone does. To keep from gaining weight, have a plan in place before you quit and stick to the plan after you quit. Your plan should include:  Having healthy snacks. When you have a craving, it may help to: ? Eat plain popcorn, crunchy carrots, celery, or other cut vegetables. ? Chew sugar-free gum.  Changing how you eat: ? Eat small portion sizes at meals. ? Eat 4-6 small meals throughout the day instead of 1-2 large meals a day. ? Be mindful when you eat. Do not watch television or do other things that might distract you as you eat.  Exercising regularly: ? Make time to exercise each day. If you do not have time for a long workout, do short bouts of exercise for 5-10 minutes several times a day. ? Do some form of strengthening exercise, like weight lifting, and some form of aerobic exercise, like running or swimming.  Drinking plenty of water or other low-calorie or no-calorie drinks. Drink 6-8 glasses of water daily, or as much as instructed by your health care provider. Summary  Quitting smoking is a physical and mental challenge. You will face cravings, withdrawal symptoms, and temptation to smoke again. Preparation can help you as you go through these challenges.  You can cope with cravings by keeping your mouth busy (such as by chewing gum), keeping your body and hands busy, and making calls to family, friends, or a helpline for people who want to quit smoking.  You can cope with withdrawal symptoms by avoiding places where people smoke, avoiding drinks with caffeine, and getting plenty of rest.  Ask your health care provider about  the different ways to prevent weight gain, avoid stress, and handle social situations. This information is not intended to replace advice given to you by your health care provider. Make sure you discuss any questions you have with your health care provider. Document Released: 03/30/2016 Document Revised: 03/15/2017 Document Reviewed: 03/30/2016 Elsevier Patient Education  2020 [employer].

## 2019-01-06 NOTE — Progress Notes (Signed)
Subjective:     Patient ID: Margaret Paul, female   DOB: February 23, 1961, 58 y.o.   MRN: [id number]  Margaret Paul presents for New Patient (Initial Visit) (establish care) Only history as being depression.  Reports that she is followed by providers [facility].  Is currently on trazodone for sleep as she does not sleep well.  And on Prozac for depression.  She reports that she was being seen by practice in [place] but has not seen a doctor in many years as she just only went when she was sick.  Will need to get records for this practice.  She reports that she has not seen an eye doctor.  Reports that she has dentures so she has not seen a dentist. Is in need of Pap smear/GYN care, mammogram, she reports colonoscopy in the last 5 years will need to get records of this.  Needs to get flu vaccine willing to get this today.  Needs to have at least a pneumonia vaccine in the next couple months.  Unsure when her last Tdap as she thinks that she might of gotten it but she is unsure.  Advised to call her insurance company as they can tell her whether or not she is received it in the last couple years if she has had the same insurance in addition to checking shingles coverage.  She also has not had hepatitis C checked or HIV that she knows of.  Has strong family history of hypertension, diabetes, with high cholesterol.  Reports that she eats a lot of meats.  Does not like vegetables.  Reports that she does not drink water.  Only drinks coffee, soda. Currently is an everyday smoker 2 packs a day.  Has been smoking she is she was 58 years old.  Reports that she quit for 6 weeks prior to having a neck surgery.  Additionally reports that she is a planned quit date of October 5 of this year.  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  Is divorced but living with a boyfriend that she has been dating for a year now.  She feels safe in the relationship.  Has 7 children.  Has 2 pets in the  home 1 dog 1 cat.  She enjoys spending time with them.  She likes going outside.  She enjoys fishing.  She also enjoys listening to music.  Of note she does report her poor diet as stated above.  Not drinking water.  Additionally it is not good about eating vegetables or fruits.  She also reports that she does not wear seatbelt.  Does not wear sun protection.  And currently does not have smoke detectors at home.  Today patient denies signs and symptoms of COVID 19 infection including fever, chills, cough, shortness of breath, and headache.  Past Medical, Surgical, Social History, Allergies, and Medications have been Reviewed.   Past Medical History:  Diagnosis Date   Depression    Past Surgical History:  Procedure Laterality Date   NECK SURGERY     rotator cuff surgery Left    TUBAL LIGATION     reversed   Social History   Socioeconomic History   Marital status: Divorced    Spouse name: Not on file   Number of children: 7   Years of education: Not on file   Highest education level: 12th grade  Occupational History   Not on file  Social Needs   Financial  resource strain: Not hard at all   Food insecurity    Worry: Never true    Inability: Never true   Transportation needs    Medical: No    Non-medical: No  Tobacco Use   Smoking status: Current Every Day Smoker    Packs/day: 2.00    Years: 40.00    Pack years: 80.00    Types: Cigarettes   Smokeless tobacco: Never Used   Tobacco comment: has a quit date of 01/19/2019  Substance and Sexual Activity   Alcohol use: Not Currently   Drug use: Never   Sexual activity: Yes    Birth control/protection: Post-menopausal  Lifestyle   Physical activity    Days per week: 0 days    Minutes per session: 0 min   Stress: Very much  Relationships   Social connections    Talks on phone: More than three times a week    Gets together: Once a week    Attends religious service: Never    Active member of club or  organization: No    Attends meetings of clubs or organizations: Never    Relationship status: Divorced   Intimate partner violence    Fear of current or ex partner: No    Emotionally abused: No    Physically abused: No    Forced sexual activity: No  Other Topics Concern   Not on file  Social History Narrative   Lives with boyfriend- Truman Hayward dating a year in [place]      Dog-Beagle: [place] and [place]      Enjoys listen to music and fish and being outdoors      Diet: meats, pizza, steak, salad some times, likes oranges, and grapes and plums   Caffeine: 2 coffee, 1-2 bottles daily of soda, no tea   Water: does not like water      Does not wears seat belt   Does not wears sun protection   Smoke detectors (none)   Does not use phone while driving    Outpatient Encounter Medications as of 01/06/2019  Medication Sig   FLUoxetine (PROZAC) 20 MG tablet Take 20 mg by mouth daily.   traZODone (DESYREL) 100 MG tablet Take 100 mg by mouth at bedtime.   No facility-administered encounter medications on file as of 01/06/2019.    No Known Allergies  Review of Systems  Constitutional: Negative for chills and fever.  HENT: Negative.   Eyes: Negative.  Negative for visual disturbance.  Respiratory: Negative.  Negative for cough and shortness of breath.   Cardiovascular: Negative.   Gastrointestinal: Negative.   Endocrine: Negative.   Genitourinary: Negative.   Musculoskeletal: Negative.   Skin: Negative.   Allergic/Immunologic: Negative.   Neurological: Negative.  Negative for dizziness and headaches.  Hematological: Negative.   Psychiatric/Behavioral: Positive for sleep disturbance.  All other systems reviewed and are negative.      Objective:     BP 120/76    Pulse 78    Temp 98.1 F (36.7 C) (Oral)    Resp 12    Ht 5\' 1"  (1.549 m)    Wt 172 lb (78 kg)    SpO2 96%    BMI 32.50 kg/m   Physical Exam Vitals signs and nursing note reviewed.  Constitutional:      Appearance:  Normal appearance. She is well-developed and well-groomed. She is obese.  HENT:     Head: Normocephalic and atraumatic.     Right Ear: External ear normal.  Left Ear: External ear normal.     Nose: Nose normal.     Mouth/Throat:     Mouth: Mucous membranes are moist.     Pharynx: Oropharynx is clear.  Eyes:     General:        Right eye: No discharge.        Left eye: No discharge.     Conjunctiva/sclera: Conjunctivae normal.  Neck:     Musculoskeletal: Normal range of motion and neck supple.  Cardiovascular:     Rate and Rhythm: Normal rate and regular rhythm.     Pulses: Normal pulses.     Heart sounds: Normal heart sounds.  Pulmonary:     Effort: Pulmonary effort is normal.     Breath sounds: Normal breath sounds.  Musculoskeletal: Normal range of motion.  Skin:    General: Skin is warm.  Neurological:     General: No focal deficit present.     Mental Status: She is alert and oriented to person, place, and time.  Psychiatric:        Attention and Perception: Attention normal.        Mood and Affect: Mood normal.        Speech: Speech normal.        Behavior: Behavior normal. Behavior is cooperative.        Thought Content: Thought content normal.        Cognition and Memory: Cognition normal.        Judgment: Judgment normal.        Assessment and Plan        1. Obesity (BMI 30-39.9) Margaret Paul is educated about the importance of exercise daily to help with weight management. A minumum of 30 minutes daily is recommended. Additionally, importance of healthy food choices  with portion control discussed.   - Hemoglobin A1c - Lipid panel  2. Prehypertension Margaret Paul is encouraged to maintain a well balanced diet that is low in salt. Additionally, she is also reminded that exercise is beneficial for heart health and control of  Blood pressure. 30-60 minutes daily is recommended-walking was suggested.  - CBC - COMPLETE METABOLIC PANEL WITH GFR  3.  Other fatigue Has fatigue, does not recall having labs in last year. No records, will need to get these.  - TSH - VITAMIN D 25 Hydroxy (Vit-D Deficiency, Fractures)  4. Vitamin D deficiency  - VITAMIN D 25 Hydroxy (Vit-D Deficiency, Fractures)  5. Need for immunization against influenza Patient was educated on the recommendation for flu vaccine. After obtaining informed consent, the vaccine was administered no adverse effects noted at time of administration. Patient provided with education on arm soreness and use of tylenol or ibuprofen (if safe) for this. Encourage to use the arm vaccine was given in to help reduce the soreness. Patient educated on the signs of a reaction to the vaccine and advised to contact the office should these occur.   - Flu Vaccine QUAD 36+ mos IM  6. Encounter for hepatitis C screening test for low risk patient US task force recommendation  - HEP C AB W/REFL  7. Encounter for screening for human immunodeficiency virus (HIV) [place] task force recommendation  - HIV Antibody (routine testing w rflx)  8. Encounter for screening for malignant neoplasm of breast Has not had one in several years per her  - MM 3D SCREEN BREAST BILATERAL; Future  9. Encounter for screening for malignant neoplasm of cervix Has not had one  since her last child was born.  - Ambulatory referral to Obstetrics / Gynecology   Follow Up: 6 months or PRN   Freddy Finner, DNP, AGNP-BC [facility] [address], [address] [place], [place] [address] Office Hours: Mon-Thurs 8 am-5 pm; Fri 8 am-12 pm Office Phone:  [phone]  Office Fax: [phone]

## 2019-01-08 LAB — VITAMIN D 25 HYDROXY (VIT D DEFICIENCY, FRACTURES): Vit D, 25-Hydroxy: 32 ng/mL (ref 30–100)

## 2019-01-08 LAB — CBC
HCT: 43.9 % (ref 35.0–45.0)
Hemoglobin: 15.3 g/dL (ref 11.7–15.5)
MCH: 31.7 pg (ref 27.0–33.0)
MCHC: 34.9 g/dL (ref 32.0–36.0)
MCV: 90.9 fL (ref 80.0–100.0)
MPV: 10.9 fL (ref 7.5–12.5)
Platelets: 282 10*3/uL (ref 140–400)
RBC: 4.83 10*6/uL (ref 3.80–5.10)
RDW: 12.2 % (ref 11.0–15.0)
WBC: 6.4 10*3/uL (ref 3.8–10.8)

## 2019-01-08 LAB — COMPLETE METABOLIC PANEL WITH GFR
AG Ratio: 1.4 (calc) (ref 1.0–2.5)
ALT: 10 U/L (ref 6–29)
AST: 10 U/L (ref 10–35)
Albumin: 4.3 g/dL (ref 3.6–5.1)
Alkaline phosphatase (APISO): 84 U/L (ref 37–153)
BUN: 13 mg/dL (ref 7–25)
CO2: 29 mmol/L (ref 20–32)
Calcium: 9.3 mg/dL (ref 8.6–10.4)
Chloride: 105 mmol/L (ref 98–110)
Creat: 0.67 mg/dL (ref 0.50–1.05)
GFR, Est African American: 115 mL/min/{1.73_m2} (ref >=60)
GFR, Est Non African American: 99 mL/min/{1.73_m2} (ref >=60)
Globulin: 3 g/dL (calc) (ref 1.9–3.7)
Glucose, Bld: 88 mg/dL (ref 65–99)
Potassium: 4.3 mmol/L (ref 3.5–5.3)
Sodium: 140 mmol/L (ref 135–146)
Total Bilirubin: 0.5 mg/dL (ref 0.2–1.2)
Total Protein: 7.3 g/dL (ref 6.1–8.1)

## 2019-01-08 LAB — LIPID PANEL
Cholesterol: 191 mg/dL (ref <200)
HDL: 45 mg/dL — ABNORMAL LOW (ref >=50)
LDL Cholesterol (Calc): 127 mg/dL (calc) — ABNORMAL HIGH
Non-HDL Cholesterol (Calc): 146 mg/dL (calc) — ABNORMAL HIGH (ref <130)
Total CHOL/HDL Ratio: 4.2 (calc) (ref <5.0)
Triglycerides: 90 mg/dL (ref <150)

## 2019-01-08 LAB — REFLEX TIQ

## 2019-01-08 LAB — HEMOGLOBIN A1C
Hgb A1c MFr Bld: 5.1 % of total Hgb (ref <5.7)
Mean Plasma Glucose: 100 (calc)
eAG (mmol/L): 5.5 (calc)

## 2019-01-08 LAB — HEP C AB W/REFL
HEPATITIS C ANTIBODY REFILL$(REFL): NONREACTIVE
SIGNAL TO CUT-OFF: 0.04 (ref <1.00)

## 2019-01-08 LAB — TSH: TSH: 2.64 mIU/L

## 2019-01-08 LAB — HIV ANTIBODY (ROUTINE TESTING W REFLEX): HIV 1&2 Ab, 4th Generation: NONREACTIVE

## 2019-01-08 NOTE — Progress Notes (Signed)
Please call Margaret Paul and let her know that her lab results are back. Her blood level counts are great. Kidney function and liver levels are also good. A1c is outside of the prediabetic range this is great. Thyroid is within range.  Vitamin D is on the lower level of optimum.  Suggest taking an over-the-counter thousand unit tablet daily.  Add vitamin D3 Cholesterol level is a little bit off as well. Total cholesterol is right at the line, good cholesterol is a little bit low, bad cholesterol is elevated.   Would like her to implement some lifestyle changes Iincluding exercise and avoiding foods that can elevate cholesterol such as fried, fatty, butter,  oils sweets high carbohydrates and animal fats included in that.  HIV and hepatitis C negative.  Overall labs are great.  We will look at rechecking these in 3 months especially her cholesterol to make sure  that she does not need to start a statin medication if it does not come back down.

## 2019-02-05 ENCOUNTER — Other Ambulatory Visit: Payer: Self-pay

## 2019-02-05 ENCOUNTER — Ambulatory Visit (INDEPENDENT_AMBULATORY_CARE_PROVIDER_SITE_OTHER): Payer: Medicaid Other | Admitting: Family Medicine

## 2019-02-05 ENCOUNTER — Encounter: Payer: Self-pay | Admitting: Family Medicine

## 2019-02-05 VITALS — BP 118/76 | HR 85 | Temp 98.0°F | Resp 15 | Ht 61.0 in | Wt 172.0 lb

## 2019-02-05 DIAGNOSIS — L089 Local infection of the skin and subcutaneous tissue, unspecified: Secondary | ICD-10-CM | POA: Diagnosis not present

## 2019-02-05 DIAGNOSIS — B351 Tinea unguium: Secondary | ICD-10-CM

## 2019-02-05 MED ORDER — DOXYCYCLINE HYCLATE 100 MG PO TABS: 100.0000 mg | ORAL_TABLET | Freq: Two times a day (BID) | ORAL | 0 refills | Status: AC

## 2019-02-05 NOTE — Patient Instructions (Signed)
  I appreciate the opportunity to provide you with the care for your health and wellness. Today we discussed: foot infection  Follow up: 07/07/2019  No labs   Referral to podiatry placed today.  I have sent in a medication to help with this possible infection. Please take it as directed.  Drink plenty of water with each pill you take.  It was a pleasure to see you and I look forward to continuing to work together on your health and well-being. Please do not hesitate to call the office if you need care or have questions about your care.  Have a wonderful day and week. With Gratitude, Cherly Beach, DNP, AGNP-BC

## 2019-02-05 NOTE — Progress Notes (Signed)
Subjective:     Patient ID: Margaret Paul, female   DOB: [birth year], 58 y.o.   MRN: [id number]  Margaret Paul presents for blisters (right foot)  Ms. Lisbon is a 58 year old female patient who is recently newly established with me.  Has a history of depression.  Has had neck surgery and rotator cuff surgery.  Overall she reports she is doing okay outside of having some new onset blisters to the right bottom foot.  Reports started about a week and a half ago.  Reports that she started having discomfort when she was walking.  Looked at her foot and noticed that she had a little blisters all over the bottom of her foot.  She denies having walked anywhere barefoot.  She denies having any new socks or shoes.  She denies having any exposure to any wet areas or moist areas of groundglass.  She denies having any blisters on any other aspects or part of her body.  She denies having any fevers chills, nausea vomiting or any other signs or symptoms of infection.  She does note that they are itchy.  She tried to soak them in some hot water to help.  She reports that walking on the blisters hurts so her boyfriend popped them with a knife and then scraped them clean and she said that it was a liquid that came out and it was not pus filled.  She also knows that she has something wrong with her toenails as well.  Today patient denies signs and symptoms of COVID 19 infection including fever, chills, cough, shortness of breath, and headache.  Past Medical, Surgical, Social History, Allergies, and Medications have been Reviewed.  Past Medical History:  Diagnosis Date  . Depression    Past Surgical History:  Procedure Laterality Date  . NECK SURGERY    . rotator cuff surgery Left   . TUBAL LIGATION     reversed   Social History   Socioeconomic History  . Marital status: Divorced    Spouse name: Not on file  . Number of children: 7  . Years of education: Not on file  . Highest education level: 12th grade   Occupational History  . Not on file  Social Needs  . Financial resource strain: Not hard at all  . Food insecurity    Worry: Never true    Inability: Never true  . Transportation needs    Medical: No    Non-medical: No  Tobacco Use  . Smoking status: Current Every Day Smoker    Packs/day: 2.00    Years: 40.00    Pack years: 80.00    Types: Cigarettes  . Smokeless tobacco: Never Used  . Tobacco comment: has a quit date of 01/19/2019  Substance and Sexual Activity  . Alcohol use: Not Currently  . Drug use: Never  . Sexual activity: Yes    Birth control/protection: Post-menopausal  Lifestyle  . Physical activity    Days per week: 0 days    Minutes per session: 0 min  . Stress: Very much  Relationships  . Social connections    Talks on phone: More than three times a week    Gets together: Once a week    Attends religious service: Never    Active member of club or organization: No    Attends meetings of clubs or organizations: Never    Relationship status: Divorced  . Intimate partner violence    Fear of current or ex  partner: No    Emotionally abused: No    Physically abused: No    Forced sexual activity: No  Other Topics Concern  . Not on file  Social History Narrative   Lives with boyfriend- Nedra HaiLee dating a year in oct      Dog-Beagle: Magnus IvanLouie and Cat-Romeo      Enjoys listen to music and fish and being outdoors      Diet: meats, pizza, steak, salad some times, likes oranges, and grapes and plums   Caffeine: 2 coffee, 1-2 bottles daily of soda, no tea   Water: does not like water      Does not wears seat belt   Does not wears sun protection   Smoke detectors (none)   Does not use phone while driving    Outpatient Encounter Medications as of 02/05/2019  Medication Sig  . FLUoxetine (PROZAC) 20 MG tablet Take 20 mg by mouth daily.  . traZODone (DESYREL) 100 MG tablet Take 100 mg by mouth at bedtime.  Marland Kitchen. doxycycline (VIBRA-TABS) 100 MG tablet Take 1 tablet (100 mg  total) by mouth 2 (two) times daily for 7 days.   No facility-administered encounter medications on file as of 02/05/2019.    No Known Allergies  Review of Systems  Constitutional: Negative.   HENT: Negative.   Eyes: Negative.   Respiratory: Negative.   Cardiovascular: Negative.   Gastrointestinal: Negative.   Endocrine: Negative.   Genitourinary: Negative.   Musculoskeletal: Negative.   Skin:       Blisters on right foot, bottom  Allergic/Immunologic: Negative.   Neurological: Negative.   Hematological: Negative.   Psychiatric/Behavioral: Negative.   All other systems reviewed and are negative.      Objective:     BP 118/76   Pulse 85   Temp 98 F (36.7 C) (Oral)   Resp 15   Ht 5\' 1"  (1.549 m)   Wt 172 lb 0.6 oz (78 kg)   SpO2 96%   BMI 32.51 kg/m   Physical Exam Vitals signs and nursing note reviewed.  Constitutional:      Appearance: Normal appearance. She is well-developed and well-groomed. She is obese.  HENT:     Head: Normocephalic and atraumatic.     Right Ear: External ear normal.     Left Ear: External ear normal.     Nose: Nose normal.     Mouth/Throat:     Mouth: Mucous membranes are moist.     Pharynx: Oropharynx is clear.  Eyes:     General:        Right eye: No discharge.        Left eye: No discharge.     Conjunctiva/sclera: Conjunctivae normal.  Neck:     Musculoskeletal: Normal range of motion and neck supple.  Cardiovascular:     Rate and Rhythm: Normal rate and regular rhythm.     Pulses: Normal pulses.     Heart sounds: Normal heart sounds.  Pulmonary:     Effort: Pulmonary effort is normal.     Breath sounds: Normal breath sounds.  Musculoskeletal: Normal range of motion.  Feet:     Right foot:     Skin integrity: Blister, skin breakdown and dry skin present.     Toenail Condition: Right toenails are abnormally thick. Fungal disease present.    Comments: Several scabbed over and open "blistered" areas. Closed pustule areas  noted. Skin:    General: Skin is warm.     Findings: Rash  present. Rash is pustular.     Comments: Bottom of right foot  Neurological:     General: No focal deficit present.     Mental Status: She is alert and oriented to person, place, and time.  Psychiatric:        Attention and Perception: Attention normal.        Mood and Affect: Mood normal.        Speech: Speech normal.        Behavior: Behavior normal. Behavior is cooperative.        Thought Content: Thought content normal.        Cognition and Memory: Cognition normal.        Judgment: Judgment normal.        Assessment and Plan        1. Fungal infection of nail Noted to have fungal infection on great toe of right side in addition to other toes on the right.  Referral to podiatry made.   - Ambulatory referral to Podiatry  2. Skin infection Skin infection of some type.  Does appear to have pus in the opening areas and closed areas of her foot at this time.  She denies and said that it was liquid unsure if opening it exposed to bacteria and that it developed an infection.  We will be covering her with doxycycline to help with any infection that could be arising.  Referral to podiatry for possible fungal related infection spreading throughout the bottom of her foot.   Reviewed side effects, risks and benefits of medication.   Patient acknowledged agreement and understanding of the plan.   - Ambulatory referral to Podiatry - doxycycline (VIBRA-TABS) 100 MG tablet; Take 1 tablet (100 mg total) by mouth 2 (two) times daily for 7 days.  Dispense: 14 tablet; Refill: 0  Follow-up: 07/07/2019   Perlie Mayo, DNP, AGNP-BC [address], [place], [place] [address] Office Hours: Mon-Thurs 8 am-5 pm; Fri 8 am-12 pm Office Phone:  [phone]  Office Fax: [phone]

## 2019-02-06 ENCOUNTER — Other Ambulatory Visit: Payer: Self-pay

## 2019-02-06 DIAGNOSIS — Z20822 Contact with and (suspected) exposure to covid-19: Secondary | ICD-10-CM

## 2019-02-08 LAB — NOVEL CORONAVIRUS, NAA: SARS-CoV-2, NAA: NOT DETECTED

## 2019-02-23 ENCOUNTER — Other Ambulatory Visit: Payer: Medicaid Other | Admitting: Obstetrics and Gynecology

## 2019-02-27 ENCOUNTER — Other Ambulatory Visit: Payer: Self-pay

## 2019-02-27 ENCOUNTER — Ambulatory Visit (INDEPENDENT_AMBULATORY_CARE_PROVIDER_SITE_OTHER): Payer: Medicaid Other | Admitting: Podiatry

## 2019-02-27 DIAGNOSIS — B351 Tinea unguium: Secondary | ICD-10-CM

## 2019-02-27 DIAGNOSIS — B353 Tinea pedis: Secondary | ICD-10-CM

## 2019-02-27 DIAGNOSIS — M79676 Pain in unspecified toe(s): Secondary | ICD-10-CM | POA: Diagnosis not present

## 2019-02-27 MED ORDER — FLUCONAZOLE 150 MG PO TABS: 150.0000 mg | ORAL_TABLET | ORAL | 2 refills | Status: DC

## 2019-02-27 NOTE — Progress Notes (Signed)
  Subjective:  Patient ID: Margaret Paul, female    DOB: [birth year],  MRN: [id number]  Chief Complaint  Patient presents with  . Foot Problem    Pt states right foot plantar blisters which have popped recently. Pt states they were itching and had clear drainage.  . Nail Problem    Pt states fungal nails on right foot.    58 y.o. female presents with the above complaint. Hx above confirmed with patient.   Review of Systems: Negative except as noted in the HPI. Denies N/V/F/Ch.  Past Medical History:  Diagnosis Date  . Depression     Current Outpatient Medications:  .  FLUoxetine (PROZAC) 20 MG capsule, Take by mouth., Disp: , Rfl:  .  FLUoxetine (PROZAC) 20 MG tablet, Take 20 mg by mouth daily., Disp: , Rfl:  .  traZODone (DESYREL) 100 MG tablet, Take 100 mg by mouth at bedtime., Disp: , Rfl:  .  traZODone (DESYREL) 100 MG tablet, Take by mouth., Disp: , Rfl:  .  fluconazole (DIFLUCAN) 150 MG tablet, Take 1 tablet (150 mg total) by mouth once a week., Disp: 4 tablet, Rfl: 2  Social History   Tobacco Use  Smoking Status Current Every Day Smoker  . Packs/day: 2.00  . Years: 40.00  . Pack years: 80.00  . Types: Cigarettes  Smokeless Tobacco Never Used  Tobacco Comment   has a quit date of 01/19/2019    No Known Allergies Objective:  There were no vitals filed for this visit. There is no height or weight on file to calculate BMI. Constitutional Well developed. Well nourished.  Vascular Dorsalis pedis pulses palpable bilaterally. Posterior tibial pulses palpable bilaterally. Capillary refill normal to all digits.  No cyanosis or clubbing noted. Pedal hair growth normal.  Neurologic Normal speech. Oriented to person, place, and time. Epicritic sensation to light touch grossly present bilaterally.  Dermatologic Nails right toenails thickened and dystrophic Skin right plantar foot xerotic, scaling  Orthopedic: Normal joint ROM without pain or crepitus bilaterally. No  visible deformities. No bony tenderness.   Radiographs: None Assessment:   1. Onychomycosis   2. Pain around toenail   3. Tinea pedis of both feet    Plan:  Patient was evaluated and treated and all questions answered.  Onychomycosis/Tinea Pedis -Rx Fluconazole weekly -Educated on pedal hygiene -F/u PRN  Return if symptoms worsen or fail to improve.

## 2019-03-24 ENCOUNTER — Other Ambulatory Visit: Payer: Self-pay

## 2019-03-24 DIAGNOSIS — Z20822 Contact with and (suspected) exposure to covid-19: Secondary | ICD-10-CM

## 2019-03-25 ENCOUNTER — Encounter: Payer: Self-pay | Admitting: Obstetrics and Gynecology

## 2019-03-25 ENCOUNTER — Other Ambulatory Visit (HOSPITAL_COMMUNITY)
Admission: RE | Admit: 2019-03-25 | Discharge: 2019-03-25 | Disposition: A | Discharge status: Home / Self Care | Payer: Medicaid Other | Source: Ambulatory Visit | Attending: Obstetrics and Gynecology | Admitting: Obstetrics and Gynecology

## 2019-03-25 ENCOUNTER — Ambulatory Visit (INDEPENDENT_AMBULATORY_CARE_PROVIDER_SITE_OTHER): Payer: Medicaid Other | Admitting: Obstetrics and Gynecology

## 2019-03-25 ENCOUNTER — Other Ambulatory Visit: Payer: Self-pay

## 2019-03-25 VITALS — BP 125/72 | HR 89 | Ht 61.0 in | Wt 164.8 lb

## 2019-03-25 DIAGNOSIS — Z0000 Encounter for general adult medical examination without abnormal findings: Secondary | ICD-10-CM

## 2019-03-25 NOTE — Progress Notes (Addendum)
Patient ID: Margaret Paul, female   DOB: 12/19/1960, 58 y.o.   MRN: [id number]   Assessment:  1. Annual Gyn Exam 2. Postmenopausal female Plan:  1. Pap smear done, next pap due 5 years 2. Return annually or prn 3    Annual mammogram advised after age 60 Subjective:  Margaret Paul is a 58 y.o. female No obstetric history on file. who presents for annual exam. No LMP recorded. The patient has complaints today of occasional vaginal itching. She has not had a PAP in over 10 years and has not had a mammogram since 2005. 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 She has seven children.   The following portions of the patient's history were reviewed and updated as appropriate: allergies, current medications, past family history, past medical history, past social history, past surgical history and problem list. Past Medical History:  Diagnosis Date  . Depression     Past Surgical History:  Procedure Laterality Date  . NECK SURGERY    . rotator cuff surgery Left   . TUBAL LIGATION     reversed     Current Outpatient Medications:  .  fluconazole (DIFLUCAN) 150 MG tablet, Take 1 tablet (150 mg total) by mouth once a week., Disp: 4 tablet, Rfl: 2 .  FLUoxetine (PROZAC) 20 MG capsule, Take by mouth., Disp: , Rfl:  .  FLUoxetine (PROZAC) 20 MG tablet, Take 20 mg by mouth daily., Disp: , Rfl:  .  traZODone (DESYREL) 100 MG tablet, Take 100 mg by mouth at bedtime., Disp: , Rfl:  .  traZODone (DESYREL) 100 MG tablet, Take by mouth., Disp: , Rfl:   Review of Systems Constitutional: negative Gastrointestinal: negative Genitourinary: negative All other parts of ROS negative or in HPI  Objective:  There were no vitals taken for this visit.   BMI: There is no height or weight on file to calculate BMI.  General Appearance: Alert, appropriate appearance for age. No acute distress HEENT: Grossly normal Neck / Thyroid:  Cardiovascular: RRR; normal S1, S2, no  murmur Lungs: CTA bilaterally Back: No CVAT Breast Exam: No dimpling, nipple retraction or discharge. Normal to inspection, Normal breast tissue bilaterally and No masses or nodes. Gastrointestinal: Soft, non-tender, no masses or organomegaly Pelvic Exam: Cervix: normal, adequately supported Vagina: normal secretions Rectovaginal: not indicated and guaiac negative stool obtained Lymphatic Exam: Non-palpable nodes in neck, clavicular, axillary, or inguinal regions  Skin: Hyperkeratosis on back of left hand Neurologic: Normal gait and speech, no tremor  Psychiatric: Alert and oriented, appropriate affect.  Urinalysis:Not done  By signing my name below, I, De Burrs, attest that this documentation has been prepared under the direction and in the presence of Jonnie Kind, MD. Electronically Signed: De Burrs, Medical Scribe. 03/25/19. 1:35 PM.  I personally performed the services described in this documentation, which was SCRIBED in my presence. The recorded information has been reviewed and considered accurate. It has been edited as necessary during review. Jonnie Kind, MD

## 2019-03-26 LAB — NOVEL CORONAVIRUS, NAA: SARS-CoV-2, NAA: NOT DETECTED

## 2019-03-27 LAB — CYTOLOGY - PAP
Chlamydia: NEGATIVE
Comment: NEGATIVE
Comment: NEGATIVE
Comment: NEGATIVE
Comment: NEGATIVE
Comment: NORMAL
Diagnosis: NEGATIVE
HPV 16: NEGATIVE
HPV 18 / 45: NEGATIVE
High risk HPV: POSITIVE — AB
Neisseria Gonorrhea: NEGATIVE

## 2019-04-06 ENCOUNTER — Telehealth: Payer: Self-pay | Admitting: *Deleted

## 2019-04-06 ENCOUNTER — Ambulatory Visit (INDEPENDENT_AMBULATORY_CARE_PROVIDER_SITE_OTHER): Payer: Medicaid Other | Admitting: Family Medicine

## 2019-04-06 ENCOUNTER — Encounter: Payer: Self-pay | Admitting: Family Medicine

## 2019-04-06 ENCOUNTER — Other Ambulatory Visit: Payer: Self-pay

## 2019-04-06 DIAGNOSIS — T7840XA Allergy, unspecified, initial encounter: Secondary | ICD-10-CM

## 2019-04-06 DIAGNOSIS — Z720 Tobacco use: Secondary | ICD-10-CM

## 2019-04-06 MED ORDER — CETIRIZINE HCL 10 MG PO TABS: 10.0000 mg | ORAL_TABLET | Freq: Every day | ORAL | 3 refills | Status: AC

## 2019-04-06 NOTE — Assessment & Plan Note (Signed)
Asked about quitting: confirms they are currently smokes cigarettes Advise to quit smoking: Educated about QUITTING to reduce the risk of cancer, cardio and cerebrovascular disease. Assess willingness: Unwilling to quit at this time, but is working on cutting back. Assist with counseling and pharmacotherapy: Counseled for 5 minutes and literature provided. Arrange for follow up:  not quitting follow up in 3 months and continue to offer help.   

## 2019-04-06 NOTE — Telephone Encounter (Signed)
Pt called said she needed Margaret Paul to call her in some zyrteck to the pharmacy

## 2019-04-06 NOTE — Assessment & Plan Note (Signed)
Use of kerosene heater in the house.  Recent starting possibly flareup and allergy response.  Is currently a smoker as well.  Strongly advised to reduce smoking and definitely not smoke around the kerosene heater.  Will start allergy medicine to help with symptom management.  No other signs of symptoms at this time.  Covid testing pending.

## 2019-04-06 NOTE — Patient Instructions (Signed)
  I appreciate the opportunity to provide you with care for your health and wellness. Today we discussed: allergies  Follow up: 3 months   No labs or referrals today  Please start taking your allergy medication as directed.  I hope you have a wonderful, happy, safe, and healthy Holiday Season! See you in the New Year :)  Please continue to practice social distancing to keep you, your family, and our community safe.  If you must go out, please wear a mask and practice good handwashing.  It was a pleasure to see you and I look forward to continuing to work together on your health and well-being. Please do not hesitate to call the office if you need care or have questions about your care.  Have a wonderful day and week. With Gratitude, Cherly Beach, DNP, AGNP-BC

## 2019-04-06 NOTE — Progress Notes (Signed)
Virtual Visit via Telephone Note   This visit type was conducted due to national recommendations for restrictions regarding the COVID-19 Pandemic (e.g. social distancing) in an effort to limit this patient's exposure and mitigate transmission in our community.  Due to her co-morbid illnesses, this patient is at least at moderate risk for complications without adequate follow up.  This format is felt to be most appropriate for this patient at this time.  The patient did not have access to video technology/had technical difficulties with video requiring transitioning to audio format only (telephone).  All issues noted in this document were discussed and addressed.  No physical exam could be performed with this format.   Evaluation Performed:  Follow-up visit  Date:  04/06/2019   ID:  LEANOR VORIS, DOB [birth year], MRN [id number]  Patient Location: Home Provider Location: Office  Location of Patient: Home Location of Provider: Telehealth Consent was obtain for visit to be over via telehealth. I verified that I am speaking with the correct person using two identifiers.  PCP:  Perlie Mayo, NP   Chief Complaint:  Allergy or sinus problem  History of Present Illness:    MAXINE FREDMAN is a 58 y.o. female with history of depression and current smoker.  Reports today that on Friday she started having a fever was using ibuprofen fever broke on Saturday afternoon.  Reports that she has had some sinus issues she thinks is related to the start of using a kerosene heater in the house.  Reports that she had a mild headache over the weekend but is better now.  She has a cough but that is due to smoking so it is not anything new or worsening for her.  She reports that she has minimal to no drainage from her nasal passages or coughing up any mucus at this time.  She did go to get tested for Covid this morning.  Reports that she started taking allergy medicine from her boyfriend and reports that that is  made her feel a lot better.  They turn the kerosene heater off at night.  And she knows not to smoke around the kerosene heater when he is running.  The patient does have some symptoms concerning for COVID-19 infection (fever, chills, cough, or new shortness of breath).   Past Medical, Surgical, Social History, Allergies, and Medications have been Reviewed.  Past Medical History:  Diagnosis Date  . Depression    Past Surgical History:  Procedure Laterality Date  . NECK SURGERY    . rotator cuff surgery Left   . TUBAL LIGATION     reversed     Current Meds  Medication Sig  . FLUoxetine (PROZAC) 20 MG capsule Take by mouth.  . traZODone (DESYREL) 100 MG tablet Take 200 mg by mouth at bedtime.      Allergies:   Patient has no known allergies.   ROS:   Please see the history of present illness.    All other systems reviewed and are negative.   Labs/Other Tests and Data Reviewed:    Recent Labs: 01/07/2019: ALT 10; BUN 13; Creat 0.67; Hemoglobin 15.3; Platelets 282; Potassium 4.3; Sodium 140; TSH 2.64   Recent Lipid Panel Lab Results  Component Value Date/Time   CHOL 191 01/07/2019 07:08 AM   TRIG 90 01/07/2019 07:08 AM   HDL 45 (L) 01/07/2019 07:08 AM   CHOLHDL 4.2 01/07/2019 07:08 AM   LDLCALC 127 (H) 01/07/2019 07:08 AM  Wt Readings from Last 3 Encounters:  03/25/19 164 lb 12.8 oz (74.8 kg)  02/05/19 172 lb 0.6 oz (78 kg)  01/06/19 172 lb (78 kg)     Objective:    Vital Signs:  There were no vitals taken for this visit.   GEN:  alert and oriented  RESPIRATORY:  no shortenss of breath noted in conversatin  PSYCH:  normal affect  ASSESSMENT & PLAN:    Please see problem list for details of Dx.  1. Allergy, initial encounter  2. Nicotine abuse   Time:   Today, I have spent 10 minutes with the patient with telehealth technology discussing the above problems.     Medication Adjustments/Labs and Tests Ordered: Current medicines are reviewed at length  with the patient today.  Concerns regarding medicines are outlined above.   Tests Ordered: No orders of the defined types were placed in this encounter.   Medication Changes: Meds ordered this encounter  Medications  . cetirizine (ZYRTEC) 10 MG tablet    Sig: Take 1 tablet (10 mg total) by mouth daily.    Dispense:  30 tablet    Refill:  3    Order Specific Question:   Supervising Provider    Answer:   Kerri Perches [[id number]]    Disposition:  Follow up 07/07/2019  Signed, Freddy Finner, NP  04/06/2019 12:12 PM     [facility]

## 2019-04-08 ENCOUNTER — Telehealth: Payer: Self-pay | Admitting: *Deleted

## 2019-04-08 NOTE — Telephone Encounter (Signed)
Pt got covid test results back they were negative and she did pick up zyrteck and she is feeling better Just an fyi

## 2019-07-07 ENCOUNTER — Ambulatory Visit: Payer: Medicaid Other | Admitting: Family Medicine

## 9120-01-15 DEATH — deceased
